# Patient Record
Sex: Male | Born: 1980 | Race: White | Hispanic: No
Health system: Southern US, Community
[De-identification: ages and names within clinical notes are randomized; demographics above are authoritative.]

---

## 2004-06-09 ENCOUNTER — Other Ambulatory Visit: Payer: Self-pay

## 2004-06-09 ENCOUNTER — Emergency Department: Payer: Self-pay | Admitting: Emergency Medicine

## 2005-02-28 ENCOUNTER — Emergency Department: Payer: Self-pay | Admitting: Emergency Medicine

## 2005-07-13 ENCOUNTER — Ambulatory Visit: Payer: Self-pay | Admitting: Family Medicine

## 2006-10-10 ENCOUNTER — Other Ambulatory Visit: Payer: Self-pay

## 2006-10-10 ENCOUNTER — Emergency Department: Payer: Self-pay | Admitting: Emergency Medicine

## 2007-04-17 ENCOUNTER — Emergency Department: Payer: Self-pay | Admitting: Emergency Medicine

## 2008-01-08 ENCOUNTER — Ambulatory Visit: Payer: Self-pay | Admitting: Family Medicine

## 2008-02-26 ENCOUNTER — Emergency Department: Payer: Self-pay | Admitting: Emergency Medicine

## 2008-08-22 ENCOUNTER — Ambulatory Visit: Payer: Self-pay | Admitting: Ophthalmology

## 2008-08-28 ENCOUNTER — Ambulatory Visit: Payer: Self-pay | Admitting: Ophthalmology

## 2011-07-07 ENCOUNTER — Ambulatory Visit: Payer: Self-pay

## 2012-02-12 ENCOUNTER — Ambulatory Visit: Payer: Self-pay

## 2012-03-10 ENCOUNTER — Emergency Department: Payer: Self-pay | Admitting: Emergency Medicine

## 2012-03-10 LAB — COMPREHENSIVE METABOLIC PANEL
Alkaline Phosphatase: 112 U/L (ref 50–136)
BUN: 54 mg/dL — ABNORMAL HIGH (ref 7–18)
Bilirubin,Total: 0.4 mg/dL (ref 0.2–1.0)
Chloride: 93 mmol/L — ABNORMAL LOW (ref 98–107)
Creatinine: 13.16 mg/dL — ABNORMAL HIGH (ref 0.60–1.30)
EGFR (African American): 5 — ABNORMAL LOW
Osmolality: 285 (ref 275–301)
SGPT (ALT): 16 U/L (ref 12–78)
Sodium: 134 mmol/L — ABNORMAL LOW (ref 136–145)
Total Protein: 7.5 g/dL (ref 6.4–8.2)

## 2012-03-10 LAB — CBC WITH DIFFERENTIAL/PLATELET
Basophil #: 0.1 10*3/uL (ref 0.0–0.1)
Basophil %: 1.4 %
Eosinophil #: 0.5 10*3/uL (ref 0.0–0.7)
Eosinophil %: 8.7 %
HCT: 30.2 % — ABNORMAL LOW (ref 40.0–52.0)
HGB: 10.5 g/dL — ABNORMAL LOW (ref 13.0–18.0)
Lymphocyte #: 0.9 10*3/uL — ABNORMAL LOW (ref 1.0–3.6)
Lymphocyte %: 14.8 %
MCH: 26.8 pg (ref 26.0–34.0)
MCV: 77 fL — ABNORMAL LOW (ref 80–100)
Monocyte %: 8.7 %
Neutrophil #: 4.1 10*3/uL (ref 1.4–6.5)
Platelet: 256 10*3/uL (ref 150–440)
RBC: 3.91 10*6/uL — ABNORMAL LOW (ref 4.40–5.90)
RDW: 18.5 % — ABNORMAL HIGH (ref 11.5–14.5)
WBC: 6.2 10*3/uL (ref 3.8–10.6)

## 2012-03-10 LAB — BODY FLUID CELL COUNT WITH DIFFERENTIAL
Basophil: 0 %
Lymphocytes: 0 %
Neutrophils: 0 %
Nucleated Cell Count: 0 /mm3

## 2012-03-10 LAB — PROTIME-INR
INR: 1.7
Prothrombin Time: 20.7 secs — ABNORMAL HIGH (ref 11.5–14.7)

## 2012-03-10 LAB — URINALYSIS, COMPLETE
Bilirubin,UR: NEGATIVE
Ketone: NEGATIVE
Nitrite: NEGATIVE
Ph: 9 (ref 4.5–8.0)
Protein: >=500
Specific Gravity: 1.009 (ref 1.003–1.030)
Squamous Epithelial: <1
WBC UR: 2 /HPF (ref 0–5)

## 2012-03-10 LAB — APTT: Activated PTT: 45.1 secs — ABNORMAL HIGH (ref 23.6–35.9)

## 2012-03-10 LAB — MAGNESIUM: Magnesium: 1.9 mg/dL

## 2012-03-11 LAB — URINE CULTURE

## 2012-03-14 LAB — BODY FLUID CULTURE

## 2012-03-15 LAB — CULTURE, BLOOD (SINGLE)

## 2012-04-04 ENCOUNTER — Emergency Department: Payer: Self-pay | Admitting: Emergency Medicine

## 2012-04-04 LAB — COMPREHENSIVE METABOLIC PANEL
Albumin: 2.4 g/dL — ABNORMAL LOW (ref 3.4–5.0)
Anion Gap: 18 — ABNORMAL HIGH (ref 7–16)
Calcium, Total: 8.6 mg/dL (ref 8.5–10.1)
Chloride: 91 mmol/L — ABNORMAL LOW (ref 98–107)
Co2: 24 mmol/L (ref 21–32)
Creatinine: 13.44 mg/dL — ABNORMAL HIGH (ref 0.60–1.30)
EGFR (African American): 5 — ABNORMAL LOW
EGFR (Non-African Amer.): 4 — ABNORMAL LOW
Glucose: 203 mg/dL — ABNORMAL HIGH (ref 65–99)
Osmolality: 286 (ref 275–301)
Potassium: 2.6 mmol/L — ABNORMAL LOW (ref 3.5–5.1)
SGOT(AST): 25 U/L (ref 15–37)
Sodium: 133 mmol/L — ABNORMAL LOW (ref 136–145)
Total Protein: 7.8 g/dL (ref 6.4–8.2)

## 2012-04-04 LAB — SEDIMENTATION RATE: Erythrocyte Sed Rate: >140 mm/hr — ABNORMAL HIGH (ref 0–15)

## 2012-04-04 LAB — CBC
HCT: 27.7 % — ABNORMAL LOW (ref 40.0–52.0)
HGB: 9.4 g/dL — ABNORMAL LOW (ref 13.0–18.0)
MCH: 25.5 pg — ABNORMAL LOW (ref 26.0–34.0)
MCHC: 34.1 g/dL (ref 32.0–36.0)
MCV: 75 fL — ABNORMAL LOW (ref 80–100)
Platelet: 393 10*3/uL (ref 150–440)
RBC: 3.71 10*6/uL — ABNORMAL LOW (ref 4.40–5.90)
RDW: 17.4 % — ABNORMAL HIGH (ref 11.5–14.5)
WBC: 10.1 10*3/uL (ref 3.8–10.6)

## 2012-04-04 LAB — PROTIME-INR
INR: 2.5
Prothrombin Time: 27.3 secs — ABNORMAL HIGH (ref 11.5–14.7)

## 2012-08-23 ENCOUNTER — Emergency Department: Payer: Self-pay | Admitting: Emergency Medicine

## 2013-10-08 ENCOUNTER — Encounter: Payer: Self-pay | Admitting: Surgery

## 2013-10-08 ENCOUNTER — Ambulatory Visit: Payer: Self-pay | Admitting: Surgery

## 2013-10-26 ENCOUNTER — Encounter: Payer: Self-pay | Admitting: Surgery

## 2013-10-30 ENCOUNTER — Ambulatory Visit: Payer: Self-pay | Admitting: Vascular Surgery

## 2013-11-26 ENCOUNTER — Encounter: Payer: Self-pay | Admitting: Surgery

## 2013-12-03 ENCOUNTER — Encounter: Payer: Self-pay | Admitting: Surgery

## 2013-12-24 ENCOUNTER — Ambulatory Visit: Payer: Self-pay | Admitting: Physician Assistant

## 2013-12-26 ENCOUNTER — Encounter: Payer: Self-pay | Admitting: Surgery

## 2014-01-07 ENCOUNTER — Emergency Department: Payer: Self-pay | Admitting: Emergency Medicine

## 2014-01-07 LAB — COMPREHENSIVE METABOLIC PANEL
ALBUMIN: 2.8 g/dL — AB (ref 3.4–5.0)
ANION GAP: 13 (ref 7–16)
Alkaline Phosphatase: 164 U/L — ABNORMAL HIGH
BUN: 35 mg/dL — ABNORMAL HIGH (ref 7–18)
Bilirubin,Total: 0.7 mg/dL (ref 0.2–1.0)
CALCIUM: 6.5 mg/dL — AB (ref 8.5–10.1)
CHLORIDE: 98 mmol/L (ref 98–107)
Co2: 25 mmol/L (ref 21–32)
Creatinine: 11.96 mg/dL — ABNORMAL HIGH (ref 0.60–1.30)
EGFR (Non-African Amer.): 5 — ABNORMAL LOW
GFR CALC AF AMER: 6 — AB
GLUCOSE: 147 mg/dL — AB (ref 65–99)
OSMOLALITY: 283 (ref 275–301)
Potassium: 3.3 mmol/L — ABNORMAL LOW (ref 3.5–5.1)
SGOT(AST): 22 U/L (ref 15–37)
SGPT (ALT): 22 U/L (ref 12–78)
Sodium: 136 mmol/L (ref 136–145)
Total Protein: 7.2 g/dL (ref 6.4–8.2)

## 2014-01-07 LAB — SALICYLATE LEVEL: Salicylates, Serum: <1.7 mg/dL

## 2014-01-07 LAB — CBC
HCT: 30.5 % — ABNORMAL LOW (ref 40.0–52.0)
HGB: 10.4 g/dL — AB (ref 13.0–18.0)
MCH: 30.9 pg (ref 26.0–34.0)
MCHC: 33.9 g/dL (ref 32.0–36.0)
MCV: 91 fL (ref 80–100)
Platelet: 220 10*3/uL (ref 150–440)
RBC: 3.35 10*6/uL — ABNORMAL LOW (ref 4.40–5.90)
RDW: 16.3 % — ABNORMAL HIGH (ref 11.5–14.5)
WBC: 8 10*3/uL (ref 3.8–10.6)

## 2014-01-07 LAB — ACETAMINOPHEN LEVEL: Acetaminophen: <2 ug/mL

## 2014-01-07 LAB — ETHANOL

## 2014-01-07 LAB — TSH: Thyroid Stimulating Horm: 1.42 u[IU]/mL

## 2014-01-08 LAB — PHOSPHORUS: Phosphorus: 6.7 mg/dL — ABNORMAL HIGH (ref 2.5–4.9)

## 2014-01-08 LAB — MAGNESIUM: MAGNESIUM: 1.6 mg/dL — AB

## 2014-03-18 ENCOUNTER — Encounter: Payer: Self-pay | Admitting: Surgery

## 2014-03-28 ENCOUNTER — Encounter: Payer: Self-pay | Admitting: Surgery

## 2014-03-29 ENCOUNTER — Ambulatory Visit: Payer: Self-pay | Admitting: Physician Assistant

## 2014-04-15 LAB — COMPREHENSIVE METABOLIC PANEL
Albumin: 2.4 g/dL — ABNORMAL LOW (ref 3.4–5.0)
Alkaline Phosphatase: 226 U/L — ABNORMAL HIGH
Anion Gap: 15 (ref 7–16)
BILIRUBIN TOTAL: 0.7 mg/dL (ref 0.2–1.0)
BUN: 67 mg/dL — ABNORMAL HIGH (ref 7–18)
CHLORIDE: 99 mmol/L (ref 98–107)
Calcium, Total: 7.5 mg/dL — ABNORMAL LOW (ref 8.5–10.1)
Co2: 25 mmol/L (ref 21–32)
Creatinine: 13.55 mg/dL — ABNORMAL HIGH (ref 0.60–1.30)
EGFR (African American): 5 — ABNORMAL LOW
EGFR (Non-African Amer.): 4 — ABNORMAL LOW
Glucose: 93 mg/dL (ref 65–99)
Osmolality: 297 (ref 275–301)
Potassium: 2.7 mmol/L — ABNORMAL LOW (ref 3.5–5.1)
SGOT(AST): 80 U/L — ABNORMAL HIGH (ref 15–37)
SGPT (ALT): 90 U/L — ABNORMAL HIGH
Sodium: 139 mmol/L (ref 136–145)
TOTAL PROTEIN: 6.3 g/dL — AB (ref 6.4–8.2)

## 2014-04-15 LAB — CBC
HCT: 28.4 % — ABNORMAL LOW (ref 40.0–52.0)
HGB: 9.1 g/dL — ABNORMAL LOW (ref 13.0–18.0)
MCH: 28.5 pg (ref 26.0–34.0)
MCHC: 32.2 g/dL (ref 32.0–36.0)
MCV: 88 fL (ref 80–100)
PLATELETS: 144 10*3/uL — AB (ref 150–440)
RBC: 3.21 10*6/uL — ABNORMAL LOW (ref 4.40–5.90)
RDW: 16.3 % — AB (ref 11.5–14.5)
WBC: 5.9 10*3/uL (ref 3.8–10.6)

## 2014-04-16 ENCOUNTER — Inpatient Hospital Stay: Payer: Self-pay | Admitting: Internal Medicine

## 2014-04-16 LAB — BASIC METABOLIC PANEL
ANION GAP: 15 (ref 7–16)
BUN: 74 mg/dL — AB (ref 7–18)
CREATININE: 14.08 mg/dL — AB (ref 0.60–1.30)
Calcium, Total: 7.5 mg/dL — ABNORMAL LOW (ref 8.5–10.1)
Chloride: 100 mmol/L (ref 98–107)
Co2: 24 mmol/L (ref 21–32)
EGFR (Non-African Amer.): 4 — ABNORMAL LOW
GFR CALC AF AMER: 5 — AB
Glucose: 99 mg/dL (ref 65–99)
OSMOLALITY: 299 (ref 275–301)
Potassium: 2.8 mmol/L — ABNORMAL LOW (ref 3.5–5.1)
Sodium: 139 mmol/L (ref 136–145)

## 2014-04-16 LAB — CBC WITH DIFFERENTIAL/PLATELET
Basophil #: 0.1 10*3/uL (ref 0.0–0.1)
Basophil %: 1.2 %
Eosinophil #: 0.4 10*3/uL (ref 0.0–0.7)
Eosinophil %: 9.3 %
HCT: 25.1 % — ABNORMAL LOW (ref 40.0–52.0)
HGB: 8.4 g/dL — ABNORMAL LOW (ref 13.0–18.0)
LYMPHS PCT: 19.5 %
Lymphocyte #: 0.8 10*3/uL — ABNORMAL LOW (ref 1.0–3.6)
MCH: 29.5 pg (ref 26.0–34.0)
MCHC: 33.3 g/dL (ref 32.0–36.0)
MCV: 89 fL (ref 80–100)
MONOS PCT: 6.8 %
Monocyte #: 0.3 x10 3/mm (ref 0.2–1.0)
Neutrophil #: 2.6 10*3/uL (ref 1.4–6.5)
Neutrophil %: 63.2 %
PLATELETS: 113 10*3/uL — AB (ref 150–440)
RBC: 2.84 10*6/uL — ABNORMAL LOW (ref 4.40–5.90)
RDW: 16.3 % — ABNORMAL HIGH (ref 11.5–14.5)
WBC: 4.2 10*3/uL (ref 3.8–10.6)

## 2014-04-16 LAB — MAGNESIUM: Magnesium: 1.7 mg/dL — ABNORMAL LOW

## 2014-04-16 LAB — POTASSIUM: Potassium: 3.4 mmol/L — ABNORMAL LOW (ref 3.5–5.1)

## 2014-04-16 LAB — PHOSPHORUS: Phosphorus: 7 mg/dL — ABNORMAL HIGH (ref 2.5–4.9)

## 2014-04-17 ENCOUNTER — Encounter: Payer: Self-pay | Admitting: Surgery

## 2014-04-25 ENCOUNTER — Encounter: Payer: Self-pay | Admitting: General Surgery

## 2014-04-28 ENCOUNTER — Encounter: Payer: Self-pay | Admitting: General Surgery

## 2014-05-04 ENCOUNTER — Emergency Department: Payer: Self-pay | Admitting: Emergency Medicine

## 2014-05-04 LAB — COMPREHENSIVE METABOLIC PANEL
ALK PHOS: 349 U/L — AB
Albumin: 2.4 g/dL — ABNORMAL LOW (ref 3.4–5.0)
Anion Gap: 10 (ref 7–16)
BUN: 56 mg/dL — ABNORMAL HIGH (ref 7–18)
Bilirubin,Total: 0.6 mg/dL (ref 0.2–1.0)
CO2: 27 mmol/L (ref 21–32)
Calcium, Total: 7.6 mg/dL — ABNORMAL LOW (ref 8.5–10.1)
Chloride: 94 mmol/L — ABNORMAL LOW (ref 98–107)
Creatinine: 11.02 mg/dL — ABNORMAL HIGH (ref 0.60–1.30)
GFR CALC AF AMER: 7 — AB
GFR CALC NON AF AMER: 6 — AB
Glucose: 119 mg/dL — ABNORMAL HIGH (ref 65–99)
Osmolality: 279 (ref 275–301)
Potassium: 3.4 mmol/L — ABNORMAL LOW (ref 3.5–5.1)
SGOT(AST): 58 U/L — ABNORMAL HIGH (ref 15–37)
SGPT (ALT): 131 U/L — ABNORMAL HIGH
SODIUM: 131 mmol/L — AB (ref 136–145)
Total Protein: 7.4 g/dL (ref 6.4–8.2)

## 2014-05-04 LAB — CBC
HCT: 28.6 % — ABNORMAL LOW (ref 40.0–52.0)
HGB: 9.7 g/dL — AB (ref 13.0–18.0)
MCH: 30 pg (ref 26.0–34.0)
MCHC: 33.8 g/dL (ref 32.0–36.0)
MCV: 89 fL (ref 80–100)
PLATELETS: 234 10*3/uL (ref 150–440)
RBC: 3.22 10*6/uL — ABNORMAL LOW (ref 4.40–5.90)
RDW: 17.3 % — AB (ref 11.5–14.5)
WBC: 9 10*3/uL (ref 3.8–10.6)

## 2014-05-04 LAB — PRO B NATRIURETIC PEPTIDE: B-Type Natriuretic Peptide: 159080 pg/mL — ABNORMAL HIGH (ref 0–125)

## 2014-05-04 LAB — TROPONIN I: Troponin-I: 0.18 ng/mL — ABNORMAL HIGH

## 2014-05-06 ENCOUNTER — Ambulatory Visit: Payer: Self-pay | Admitting: General Surgery

## 2014-05-06 ENCOUNTER — Encounter: Payer: Self-pay | Admitting: Surgery

## 2014-05-16 LAB — CBC WITH DIFFERENTIAL/PLATELET
BASOS ABS: 0.1 10*3/uL (ref 0.0–0.1)
Basophil %: 0.6 %
EOS ABS: 0.2 10*3/uL (ref 0.0–0.7)
Eosinophil %: 1.7 %
HCT: 30.1 % — ABNORMAL LOW (ref 40.0–52.0)
HGB: 10 g/dL — AB (ref 13.0–18.0)
LYMPHS ABS: 0.8 10*3/uL — AB (ref 1.0–3.6)
Lymphocyte %: 7.1 %
MCH: 28.2 pg (ref 26.0–34.0)
MCHC: 33.1 g/dL (ref 32.0–36.0)
MCV: 85 fL (ref 80–100)
Monocyte #: 0.6 x10 3/mm (ref 0.2–1.0)
Monocyte %: 5 %
Neutrophil #: 9.5 10*3/uL — ABNORMAL HIGH (ref 1.4–6.5)
Neutrophil %: 85.6 %
PLATELETS: 283 10*3/uL (ref 150–440)
RBC: 3.53 10*6/uL — ABNORMAL LOW (ref 4.40–5.90)
RDW: 18.2 % — ABNORMAL HIGH (ref 11.5–14.5)
WBC: 11.1 10*3/uL — ABNORMAL HIGH (ref 3.8–10.6)

## 2014-05-16 LAB — COMPREHENSIVE METABOLIC PANEL
ALBUMIN: 2.1 g/dL — AB (ref 3.4–5.0)
ALT: 255 U/L — AB
ANION GAP: 13 (ref 7–16)
Alkaline Phosphatase: 694 U/L — ABNORMAL HIGH
BUN: 45 mg/dL — ABNORMAL HIGH (ref 7–18)
Bilirubin,Total: 0.9 mg/dL (ref 0.2–1.0)
CALCIUM: 7.5 mg/dL — AB (ref 8.5–10.1)
CHLORIDE: 95 mmol/L — AB (ref 98–107)
Co2: 26 mmol/L (ref 21–32)
Creatinine: 10.51 mg/dL — ABNORMAL HIGH (ref 0.60–1.30)
EGFR (African American): 7 — ABNORMAL LOW
GFR CALC NON AF AMER: 6 — AB
GLUCOSE: 130 mg/dL — AB (ref 65–99)
OSMOLALITY: 282 (ref 275–301)
Potassium: 2.8 mmol/L — ABNORMAL LOW (ref 3.5–5.1)
SGOT(AST): 229 U/L — ABNORMAL HIGH (ref 15–37)
SODIUM: 134 mmol/L — AB (ref 136–145)
Total Protein: 7.4 g/dL (ref 6.4–8.2)

## 2014-05-16 LAB — LIPASE, BLOOD: LIPASE: 58 U/L — AB (ref 73–393)

## 2014-05-17 ENCOUNTER — Inpatient Hospital Stay: Payer: Self-pay | Admitting: Internal Medicine

## 2014-05-17 LAB — URINALYSIS, COMPLETE
BACTERIA: NONE SEEN
Bilirubin,UR: NEGATIVE
Glucose,UR: 150 mg/dL (ref 0–75)
KETONE: NEGATIVE
Nitrite: NEGATIVE
Ph: 5 (ref 4.5–8.0)
RBC,UR: 5 /HPF (ref 0–5)
Specific Gravity: 1.013 (ref 1.003–1.030)

## 2014-05-17 LAB — BODY FLUID CELL COUNT WITH DIFFERENTIAL
BASOS ABS: 0 %
Eosinophil: 1 %
LYMPHS PCT: 0 %
Neutrophils: 95 %
Nucleated Cell Count: 1587 /mm3
Other Cells BF: 0 %
Other Mononuclear Cells: 4 %

## 2014-05-17 LAB — HEMOGLOBIN A1C: HEMOGLOBIN A1C: 5.8 % (ref 4.2–6.3)

## 2014-05-18 LAB — CBC WITH DIFFERENTIAL/PLATELET
BASOS ABS: 0 10*3/uL (ref 0.0–0.1)
BASOS PCT: 0.3 %
Eosinophil #: 0.3 10*3/uL (ref 0.0–0.7)
Eosinophil %: 2.5 %
HCT: 22.6 % — AB (ref 40.0–52.0)
HGB: 7.5 g/dL — AB (ref 13.0–18.0)
LYMPHS PCT: 6.4 %
Lymphocyte #: 0.8 10*3/uL — ABNORMAL LOW (ref 1.0–3.6)
MCH: 28.6 pg (ref 26.0–34.0)
MCHC: 33.3 g/dL (ref 32.0–36.0)
MCV: 86 fL (ref 80–100)
Monocyte #: 0.7 x10 3/mm (ref 0.2–1.0)
Monocyte %: 5.1 %
NEUTROS ABS: 11.3 10*3/uL — AB (ref 1.4–6.5)
Neutrophil %: 85.7 %
Platelet: 180 10*3/uL (ref 150–440)
RBC: 2.63 10*6/uL — AB (ref 4.40–5.90)
RDW: 18.1 % — ABNORMAL HIGH (ref 11.5–14.5)
WBC: 13.2 10*3/uL — ABNORMAL HIGH (ref 3.8–10.6)

## 2014-05-18 LAB — BASIC METABOLIC PANEL
Anion Gap: 12 (ref 7–16)
BUN: 49 mg/dL — ABNORMAL HIGH (ref 7–18)
CHLORIDE: 93 mmol/L — AB (ref 98–107)
Calcium, Total: 6.6 mg/dL — CL (ref 8.5–10.1)
Co2: 29 mmol/L (ref 21–32)
Creatinine: 11.07 mg/dL — ABNORMAL HIGH (ref 0.60–1.30)
EGFR (African American): 7 — ABNORMAL LOW
EGFR (Non-African Amer.): 6 — ABNORMAL LOW
Glucose: 209 mg/dL — ABNORMAL HIGH (ref 65–99)
Osmolality: 287 (ref 275–301)
Potassium: 2.6 mmol/L — ABNORMAL LOW (ref 3.5–5.1)
SODIUM: 134 mmol/L — AB (ref 136–145)

## 2014-05-18 LAB — PHOSPHORUS: Phosphorus: 5.3 mg/dL — ABNORMAL HIGH (ref 2.5–4.9)

## 2014-05-19 LAB — BODY FLUID CELL COUNT WITH DIFFERENTIAL
BASOS ABS: 0 %
Eosinophil: 4 %
Lymphocytes: 8 %
NUCLEATED CELL COUNT: 11 /mm3
Neutrophils: 77 %
OTHER MONONUCLEAR CELLS: 11 %
Other Cells BF: 0 %

## 2014-05-19 LAB — COMPREHENSIVE METABOLIC PANEL
Albumin: 1.5 g/dL — ABNORMAL LOW (ref 3.4–5.0)
Alkaline Phosphatase: 392 U/L — ABNORMAL HIGH
Anion Gap: 12 (ref 7–16)
BUN: 50 mg/dL — ABNORMAL HIGH (ref 7–18)
Bilirubin,Total: 0.5 mg/dL (ref 0.2–1.0)
Calcium, Total: 7 mg/dL — CL (ref 8.5–10.1)
Chloride: 94 mmol/L — ABNORMAL LOW (ref 98–107)
Co2: 26 mmol/L (ref 21–32)
Creatinine: 10.91 mg/dL — ABNORMAL HIGH (ref 0.60–1.30)
EGFR (African American): 7 — ABNORMAL LOW
EGFR (Non-African Amer.): 6 — ABNORMAL LOW
Glucose: 260 mg/dL — ABNORMAL HIGH (ref 65–99)
Osmolality: 287 (ref 275–301)
Potassium: 2.8 mmol/L — ABNORMAL LOW (ref 3.5–5.1)
SGOT(AST): 25 U/L (ref 15–37)
SGPT (ALT): 72 U/L — ABNORMAL HIGH
Sodium: 132 mmol/L — ABNORMAL LOW (ref 136–145)
Total Protein: 5.9 g/dL — ABNORMAL LOW (ref 6.4–8.2)

## 2014-05-19 LAB — VANCOMYCIN, TROUGH: Vancomycin, Trough: 13 ug/mL (ref 10–20)

## 2014-05-20 LAB — BASIC METABOLIC PANEL
Anion Gap: 12 (ref 7–16)
BUN: 50 mg/dL — AB (ref 7–18)
Calcium, Total: 7.2 mg/dL — ABNORMAL LOW (ref 8.5–10.1)
Chloride: 96 mmol/L — ABNORMAL LOW (ref 98–107)
Co2: 28 mmol/L (ref 21–32)
Creatinine: 10.53 mg/dL — ABNORMAL HIGH (ref 0.60–1.30)
EGFR (Non-African Amer.): 6 — ABNORMAL LOW
GFR CALC AF AMER: 7 — AB
GLUCOSE: 189 mg/dL — AB (ref 65–99)
Osmolality: 290 (ref 275–301)
POTASSIUM: 2.8 mmol/L — AB (ref 3.5–5.1)
Sodium: 136 mmol/L (ref 136–145)

## 2014-05-21 LAB — CULTURE, BLOOD (SINGLE)

## 2014-05-21 LAB — BODY FLUID CULTURE

## 2014-05-22 LAB — WOUND CULTURE

## 2014-05-28 ENCOUNTER — Encounter: Payer: Self-pay | Admitting: Surgery

## 2014-06-10 ENCOUNTER — Encounter: Payer: Self-pay | Admitting: Surgery

## 2014-06-19 ENCOUNTER — Ambulatory Visit: Payer: Self-pay | Admitting: Emergency Medicine

## 2014-06-19 LAB — GC/CHLAMYDIA PROBE AMP

## 2014-06-22 ENCOUNTER — Ambulatory Visit: Payer: Self-pay | Admitting: Emergency Medicine

## 2014-06-28 ENCOUNTER — Encounter: Payer: Self-pay | Admitting: Surgery

## 2014-07-24 ENCOUNTER — Inpatient Hospital Stay: Payer: Self-pay | Admitting: Internal Medicine

## 2014-07-24 LAB — COMPREHENSIVE METABOLIC PANEL
ALK PHOS: 316 U/L — AB (ref 46–116)
ANION GAP: 14 (ref 7–16)
Albumin: 2 g/dL — ABNORMAL LOW (ref 3.4–5.0)
BUN: 62 mg/dL — ABNORMAL HIGH (ref 7–18)
Bilirubin,Total: 0.4 mg/dL (ref 0.2–1.0)
CO2: 25 mmol/L (ref 21–32)
Calcium, Total: 7.5 mg/dL — ABNORMAL LOW (ref 8.5–10.1)
Chloride: 94 mmol/L — ABNORMAL LOW (ref 98–107)
Creatinine: 11.97 mg/dL — ABNORMAL HIGH (ref 0.60–1.30)
GFR CALC AF AMER: 6 — AB
GFR CALC NON AF AMER: 5 — AB
Glucose: 163 mg/dL — ABNORMAL HIGH (ref 65–99)
OSMOLALITY: 288 (ref 275–301)
POTASSIUM: 2.8 mmol/L — AB (ref 3.5–5.1)
SGOT(AST): 63 U/L — ABNORMAL HIGH (ref 15–37)
SGPT (ALT): 51 U/L (ref 14–63)
SODIUM: 133 mmol/L — AB (ref 136–145)
Total Protein: 7.3 g/dL (ref 6.4–8.2)

## 2014-07-24 LAB — CBC
HCT: 27.6 % — ABNORMAL LOW (ref 40.0–52.0)
HGB: 9 g/dL — AB (ref 13.0–18.0)
MCH: 25.9 pg — AB (ref 26.0–34.0)
MCHC: 32.7 g/dL (ref 32.0–36.0)
MCV: 79 fL — ABNORMAL LOW (ref 80–100)
PLATELETS: 217 10*3/uL (ref 150–440)
RBC: 3.48 10*6/uL — ABNORMAL LOW (ref 4.40–5.90)
RDW: 17.3 % — ABNORMAL HIGH (ref 11.5–14.5)
WBC: 8.9 10*3/uL (ref 3.8–10.6)

## 2014-07-24 LAB — APTT: Activated PTT: 32.6 secs (ref 23.6–35.9)

## 2014-07-24 LAB — CK TOTAL AND CKMB (NOT AT ARMC)
CK, Total: 94 U/L (ref 39–308)
CK-MB: 6.6 ng/mL — AB (ref 0.5–3.6)

## 2014-07-24 LAB — PROTIME-INR
INR: 1.3
Prothrombin Time: 15.5 secs — ABNORMAL HIGH (ref 11.5–14.7)

## 2014-07-24 LAB — TROPONIN I: Troponin-I: 0.22 ng/mL — ABNORMAL HIGH

## 2014-07-25 LAB — PRO B NATRIURETIC PEPTIDE: B-Type Natriuretic Peptide: >175000 pg/mL — ABNORMAL HIGH (ref 0–125)

## 2014-07-25 LAB — TSH: Thyroid Stimulating Horm: 1.87 u[IU]/mL

## 2014-07-25 LAB — POTASSIUM
POTASSIUM: 3 mmol/L — AB (ref 3.5–5.1)
Potassium: 4.1 mmol/L (ref 3.5–5.1)

## 2014-07-26 LAB — PHOSPHORUS: Phosphorus: 8.9 mg/dL — ABNORMAL HIGH (ref 2.5–4.9)

## 2014-07-26 LAB — BASIC METABOLIC PANEL
Anion Gap: 14 (ref 7–16)
BUN: 73 mg/dL — ABNORMAL HIGH (ref 7–18)
CO2: 25 mmol/L (ref 21–32)
Calcium, Total: 8.2 mg/dL — ABNORMAL LOW (ref 8.5–10.1)
Chloride: 94 mmol/L — ABNORMAL LOW (ref 98–107)
Creatinine: 13.29 mg/dL — ABNORMAL HIGH (ref 0.60–1.30)
GFR CALC AF AMER: 6 — AB
GFR CALC NON AF AMER: 5 — AB
GLUCOSE: 115 mg/dL — AB (ref 65–99)
Osmolality: 289 (ref 275–301)
Potassium: 3.9 mmol/L (ref 3.5–5.1)
SODIUM: 133 mmol/L — AB (ref 136–145)

## 2014-07-26 LAB — CBC WITH DIFFERENTIAL/PLATELET
BASOS PCT: 0.6 %
Basophil #: 0 10*3/uL (ref 0.0–0.1)
EOS PCT: 3.2 %
Eosinophil #: 0.2 10*3/uL (ref 0.0–0.7)
HCT: 25.8 % — AB (ref 40.0–52.0)
HGB: 8.4 g/dL — AB (ref 13.0–18.0)
Lymphocyte #: 1.2 10*3/uL (ref 1.0–3.6)
Lymphocyte %: 17.5 %
MCH: 26 pg (ref 26.0–34.0)
MCHC: 32.5 g/dL (ref 32.0–36.0)
MCV: 80 fL (ref 80–100)
MONO ABS: 0.5 x10 3/mm (ref 0.2–1.0)
Monocyte %: 6.8 %
NEUTROS ABS: 5.1 10*3/uL (ref 1.4–6.5)
NEUTROS PCT: 71.9 %
Platelet: 150 10*3/uL (ref 150–440)
RBC: 3.23 10*6/uL — AB (ref 4.40–5.90)
RDW: 17.3 % — ABNORMAL HIGH (ref 11.5–14.5)
WBC: 7.1 10*3/uL (ref 3.8–10.6)

## 2014-07-26 LAB — APTT
ACTIVATED PTT: 72.8 s — AB (ref 23.6–35.9)
Activated PTT: 44 secs — ABNORMAL HIGH (ref 23.6–35.9)

## 2014-07-26 LAB — PROTIME-INR
INR: 1.5
Prothrombin Time: 17.8 secs — ABNORMAL HIGH (ref 11.5–14.7)

## 2014-07-26 LAB — HEPARIN LEVEL (UNFRACTIONATED): Anti-Xa(Unfractionated): 1.43 IU/mL — ABNORMAL HIGH (ref 0.30–0.70)

## 2014-07-27 LAB — HEPARIN LEVEL (UNFRACTIONATED)
Anti-Xa(Unfractionated): 0.93 IU/mL — ABNORMAL HIGH (ref 0.30–0.70)
Anti-Xa(Unfractionated): 0.7 IU/mL (ref 0.30–0.70)

## 2014-07-27 LAB — APTT
ACTIVATED PTT: 62.4 s — AB (ref 23.6–35.9)
ACTIVATED PTT: 58.9 s — AB (ref 23.6–35.9)

## 2014-07-27 LAB — CBC WITH DIFFERENTIAL/PLATELET
BASOS ABS: 0.1 10*3/uL (ref 0.0–0.1)
Basophil %: 0.7 %
EOS ABS: 0.1 10*3/uL (ref 0.0–0.7)
Eosinophil %: 1.4 %
HCT: 26.5 % — ABNORMAL LOW (ref 40.0–52.0)
HGB: 8.4 g/dL — ABNORMAL LOW (ref 13.0–18.0)
LYMPHS PCT: 10 %
Lymphocyte #: 1 10*3/uL (ref 1.0–3.6)
MCH: 25.6 pg — ABNORMAL LOW (ref 26.0–34.0)
MCHC: 31.9 g/dL — AB (ref 32.0–36.0)
MCV: 80 fL (ref 80–100)
Monocyte #: 0.5 x10 3/mm (ref 0.2–1.0)
Monocyte %: 4.7 %
NEUTROS PCT: 83.2 %
Neutrophil #: 8.2 10*3/uL — ABNORMAL HIGH (ref 1.4–6.5)
Platelet: 177 10*3/uL (ref 150–440)
RBC: 3.29 10*6/uL — ABNORMAL LOW (ref 4.40–5.90)
RDW: 17.5 % — AB (ref 11.5–14.5)
WBC: 9.9 10*3/uL (ref 3.8–10.6)

## 2014-07-28 LAB — CBC WITH DIFFERENTIAL/PLATELET
BASOS PCT: 0.6 %
Basophil #: 0.1 10*3/uL (ref 0.0–0.1)
EOS PCT: 1.3 %
Eosinophil #: 0.1 10*3/uL (ref 0.0–0.7)
HCT: 24 % — AB (ref 40.0–52.0)
HGB: 7.7 g/dL — ABNORMAL LOW (ref 13.0–18.0)
LYMPHS ABS: 1 10*3/uL (ref 1.0–3.6)
Lymphocyte %: 10.3 %
MCH: 25.9 pg — AB (ref 26.0–34.0)
MCHC: 32 g/dL (ref 32.0–36.0)
MCV: 81 fL (ref 80–100)
Monocyte #: 0.6 x10 3/mm (ref 0.2–1.0)
Monocyte %: 6 %
Neutrophil #: 8 10*3/uL — ABNORMAL HIGH (ref 1.4–6.5)
Neutrophil %: 81.8 %
Platelet: 143 10*3/uL — ABNORMAL LOW (ref 150–440)
RBC: 2.97 10*6/uL — AB (ref 4.40–5.90)
RDW: 17.8 % — ABNORMAL HIGH (ref 11.5–14.5)
WBC: 9.8 10*3/uL (ref 3.8–10.6)

## 2014-07-28 LAB — BASIC METABOLIC PANEL
ANION GAP: 11 (ref 7–16)
BUN: 47 mg/dL — ABNORMAL HIGH (ref 7–18)
Calcium, Total: 7.3 mg/dL — ABNORMAL LOW (ref 8.5–10.1)
Chloride: 94 mmol/L — ABNORMAL LOW (ref 98–107)
Co2: 27 mmol/L (ref 21–32)
Creatinine: 9.15 mg/dL — ABNORMAL HIGH (ref 0.60–1.30)
EGFR (Non-African Amer.): 7 — ABNORMAL LOW
GFR CALC AF AMER: 9 — AB
Glucose: 145 mg/dL — ABNORMAL HIGH (ref 65–99)
Osmolality: 279 (ref 275–301)
Potassium: 4 mmol/L (ref 3.5–5.1)
SODIUM: 132 mmol/L — AB (ref 136–145)

## 2014-07-28 LAB — HEPARIN LEVEL (UNFRACTIONATED): ANTI-XA(UNFRACTIONATED): 0.43 [IU]/mL (ref 0.30–0.70)

## 2014-07-29 LAB — CBC WITH DIFFERENTIAL/PLATELET
BASOS PCT: 0.2 %
BASOS PCT: 0.2 %
BASOS PCT: 0.2 %
BASOS PCT: 0.3 %
Basophil #: 0 10*3/uL (ref 0.0–0.1)
Basophil #: 0 10*3/uL (ref 0.0–0.1)
Basophil #: 0 10*3/uL (ref 0.0–0.1)
Basophil #: 0 10*3/uL (ref 0.0–0.1)
EOS ABS: 0 10*3/uL (ref 0.0–0.7)
EOS ABS: 0 10*3/uL (ref 0.0–0.7)
EOS PCT: 0.4 %
Eosinophil #: 0 10*3/uL (ref 0.0–0.7)
Eosinophil #: 0.1 10*3/uL (ref 0.0–0.7)
Eosinophil %: 0.1 %
Eosinophil %: 0.1 %
Eosinophil %: 0.1 %
HCT: 24.4 % — ABNORMAL LOW (ref 40.0–52.0)
HCT: 23.8 % — ABNORMAL LOW (ref 40.0–52.0)
HCT: 27.2 % — ABNORMAL LOW (ref 40.0–52.0)
HCT: 28.6 % — ABNORMAL LOW (ref 40.0–52.0)
HGB: 7.6 g/dL — AB (ref 13.0–18.0)
HGB: 7.6 g/dL — ABNORMAL LOW (ref 13.0–18.0)
HGB: 8.6 g/dL — ABNORMAL LOW (ref 13.0–18.0)
HGB: 8.6 g/dL — ABNORMAL LOW (ref 13.0–18.0)
LYMPHS ABS: 0.9 10*3/uL — AB (ref 1.0–3.6)
LYMPHS ABS: 0.9 10*3/uL — AB (ref 1.0–3.6)
LYMPHS PCT: 5 %
LYMPHS PCT: 4.9 %
LYMPHS PCT: 7.1 %
Lymphocyte #: 1 10*3/uL (ref 1.0–3.6)
Lymphocyte #: 1.2 10*3/uL (ref 1.0–3.6)
Lymphocyte %: 5.4 %
MCH: 25.6 pg — ABNORMAL LOW (ref 26.0–34.0)
MCH: 26 pg (ref 26.0–34.0)
MCH: 26.1 pg (ref 26.0–34.0)
MCH: 25.7 pg — ABNORMAL LOW (ref 26.0–34.0)
MCHC: 31.1 g/dL — ABNORMAL LOW (ref 32.0–36.0)
MCHC: 31.8 g/dL — ABNORMAL LOW (ref 32.0–36.0)
MCHC: 31.6 g/dL — AB (ref 32.0–36.0)
MCHC: 30.1 g/dL — ABNORMAL LOW (ref 32.0–36.0)
MCV: 82 fL (ref 80–100)
MCV: 82 fL (ref 80–100)
MCV: 83 fL (ref 80–100)
MCV: 86 fL (ref 80–100)
MONO ABS: 0.7 x10 3/mm (ref 0.2–1.0)
MONOS PCT: 5.3 %
MONOS PCT: 3.4 %
Monocyte #: 1.1 x10 3/mm — ABNORMAL HIGH (ref 0.2–1.0)
Monocyte #: 1 x10 3/mm (ref 0.2–1.0)
Monocyte #: 0.6 x10 3/mm (ref 0.2–1.0)
Monocyte %: 5.4 %
Monocyte %: 4 %
NEUTROS ABS: 17.4 10*3/uL — AB (ref 1.4–6.5)
NEUTROS ABS: 14.7 10*3/uL — AB (ref 1.4–6.5)
NEUTROS PCT: 89.4 %
NEUTROS PCT: 90.3 %
Neutrophil #: 18.2 10*3/uL — ABNORMAL HIGH (ref 1.4–6.5)
Neutrophil #: 15.7 10*3/uL — ABNORMAL HIGH (ref 1.4–6.5)
Neutrophil %: 89.4 %
Neutrophil %: 88.8 %
Platelet: 151 10*3/uL (ref 150–440)
Platelet: 153 10*3/uL (ref 150–440)
Platelet: 118 10*3/uL — ABNORMAL LOW (ref 150–440)
Platelet: 166 10*3/uL (ref 150–440)
RBC: 2.97 10*6/uL — ABNORMAL LOW (ref 4.40–5.90)
RBC: 2.92 10*6/uL — ABNORMAL LOW (ref 4.40–5.90)
RBC: 3.3 10*6/uL — AB (ref 4.40–5.90)
RBC: 3.35 10*6/uL — AB (ref 4.40–5.90)
RDW: 17.9 % — ABNORMAL HIGH (ref 11.5–14.5)
RDW: 17.9 % — AB (ref 11.5–14.5)
RDW: 18.3 % — ABNORMAL HIGH (ref 11.5–14.5)
RDW: 18.6 % — ABNORMAL HIGH (ref 11.5–14.5)
WBC: 20.4 10*3/uL — AB (ref 3.8–10.6)
WBC: 19.5 10*3/uL — ABNORMAL HIGH (ref 3.8–10.6)
WBC: 17.4 10*3/uL — ABNORMAL HIGH (ref 3.8–10.6)
WBC: 16.6 10*3/uL — ABNORMAL HIGH (ref 3.8–10.6)

## 2014-07-29 LAB — COMPREHENSIVE METABOLIC PANEL
ALK PHOS: 269 U/L — AB (ref 46–116)
Albumin: 1.7 g/dL — ABNORMAL LOW (ref 3.4–5.0)
Albumin: 1.6 g/dL — ABNORMAL LOW (ref 3.4–5.0)
Alkaline Phosphatase: 291 U/L — ABNORMAL HIGH (ref 46–116)
Anion Gap: 19 — ABNORMAL HIGH (ref 7–16)
Anion Gap: 23 — ABNORMAL HIGH (ref 7–16)
BILIRUBIN TOTAL: 0.8 mg/dL (ref 0.2–1.0)
BUN: 33 mg/dL — ABNORMAL HIGH (ref 7–18)
BUN: 38 mg/dL — AB (ref 7–18)
Bilirubin,Total: 1 mg/dL (ref 0.2–1.0)
CHLORIDE: 92 mmol/L — AB (ref 98–107)
CO2: 23 mmol/L (ref 21–32)
CO2: 20 mmol/L — AB (ref 21–32)
CREATININE: 6.47 mg/dL — AB (ref 0.60–1.30)
Calcium, Total: 8.6 mg/dL (ref 8.5–10.1)
Calcium, Total: 8.9 mg/dL (ref 8.5–10.1)
Chloride: 93 mmol/L — ABNORMAL LOW (ref 98–107)
Creatinine: 6.81 mg/dL — ABNORMAL HIGH (ref 0.60–1.30)
EGFR (African American): 13 — ABNORMAL LOW
EGFR (Non-African Amer.): 10 — ABNORMAL LOW
GFR CALC AF AMER: 12 — AB
GFR CALC NON AF AMER: 10 — AB
Glucose: 85 mg/dL (ref 65–99)
Glucose: 81 mg/dL (ref 65–99)
OSMOLALITY: 277 (ref 275–301)
Osmolality: 278 (ref 275–301)
Potassium: 4.7 mmol/L (ref 3.5–5.1)
Potassium: 4.6 mmol/L (ref 3.5–5.1)
SGOT(AST): 1600 U/L — ABNORMAL HIGH (ref 15–37)
SGPT (ALT): 808 U/L — ABNORMAL HIGH (ref 14–63)
SGPT (ALT): 1781 U/L — ABNORMAL HIGH (ref 14–63)
SODIUM: 135 mmol/L — AB (ref 136–145)
SODIUM: 135 mmol/L — AB (ref 136–145)
Total Protein: 6.4 g/dL (ref 6.4–8.2)
Total Protein: 6.3 g/dL — ABNORMAL LOW (ref 6.4–8.2)

## 2014-07-29 LAB — BASIC METABOLIC PANEL
Anion Gap: 16 (ref 7–16)
BUN: 55 mg/dL — ABNORMAL HIGH (ref 7–18)
CALCIUM: 8.1 mg/dL — AB (ref 8.5–10.1)
CO2: 21 mmol/L (ref 21–32)
Chloride: 92 mmol/L — ABNORMAL LOW (ref 98–107)
Creatinine: 10.39 mg/dL — ABNORMAL HIGH (ref 0.60–1.30)
EGFR (Non-African Amer.): 6 — ABNORMAL LOW
GFR CALC AF AMER: 7 — AB
Glucose: 119 mg/dL — ABNORMAL HIGH (ref 65–99)
OSMOLALITY: 275 (ref 275–301)
Potassium: 5.3 mmol/L — ABNORMAL HIGH (ref 3.5–5.1)
Sodium: 129 mmol/L — ABNORMAL LOW (ref 136–145)

## 2014-07-29 LAB — LACTATE DEHYDROGENASE: LDH: 1060 U/L — ABNORMAL HIGH (ref 85–241)

## 2014-07-29 LAB — MAGNESIUM: Magnesium: 2.2 mg/dL

## 2014-07-29 LAB — HEPARIN LEVEL (UNFRACTIONATED): ANTI-XA(UNFRACTIONATED): 0.34 [IU]/mL (ref 0.30–0.70)

## 2014-07-30 ENCOUNTER — Ambulatory Visit (HOSPITAL_COMMUNITY)
Admission: AD | Admit: 2014-07-30 | Discharge: 2014-07-30 | Disposition: A | Discharge status: Home / Self Care | Payer: Self-pay | Source: Other Acute Inpatient Hospital | Attending: Internal Medicine | Admitting: Internal Medicine

## 2014-07-30 DIAGNOSIS — I4891 Unspecified atrial fibrillation: Secondary | ICD-10-CM | POA: Insufficient documentation

## 2014-07-30 DIAGNOSIS — K559 Vascular disorder of intestine, unspecified: Secondary | ICD-10-CM | POA: Insufficient documentation

## 2014-07-30 DIAGNOSIS — K7290 Hepatic failure, unspecified without coma: Secondary | ICD-10-CM | POA: Insufficient documentation

## 2014-07-30 LAB — PROTIME-INR
INR: 2
PROTHROMBIN TIME: 23 s — AB

## 2014-08-27 DEATH — deceased

## 2014-10-19 NOTE — Discharge Summary (Signed)
PATIENT NAME:  Jacob Davis, Jacob Davis MR#:  [id number] DATE OF BIRTH:  [birth year]  DATE OF ADMISSION:  04/16/2014 DATE OF DISCHARGE:  04/16/2014  FINAL DIAGNOSES:  1.  Fracture of pelvis, pubic rami fractures.  2.  End-stage renal disease on hemodialysis.  3.  Diabetes.  4.  Hypokalemia.   MEDICATIONS ON DISCHARGE: Include clonazepam 0.5 mg 4 times a day as needed for anxiety and nervousness, Sensipar 30 mg 2 tablets daily, carvedilol 25 mg twice a day, Celexa 10 mg daily, hydroxyzine hydrochloride 1 tablet 4 times a day as needed for itching, potassium chloride 20 mEq twice a day, Novolin-N sliding scale, NovoLog sliding scale, aspirin 500 mg 2 tablets every 6 hours as needed, Renvela 800 mg 3 tablets 3 times a day with meals and 2 tablets twice a day with snacks, acetaminophen oxycodone 325/5 at 1 tablet every 8 hours as needed for severe pain.   DIET: Renal diet, regular consistency.   ACTIVITY: As tolerated.   FOLLOW-UP:   1 to 2 weeks with Dr. Trey SailorsAmy Mottl.   The patient was admitted 04/15/2014 and discharged 04/16/2014. He came in with hip pain, found to have pubic rami fractures and admitted to the hospital.  Percocet was given for pain control.  Physical therapy consultation was ordered.    LABORATORY AND RADIOLOGICAL DATA DURING THE HOSPITAL COURSE: Included a chest x-ray that showed no acute cardiopulmonary disease.  Left hip x-ray showed mild or minimally displaced left pubic rami fractures are new since 2013, favored to be acute in this setting.  White blood cell count 5.9, hemoglobin and hematocrit 9.1 and 28.4, platelet count of 144,000, glucose 93, BUN 67, creatinine 13.55, sodium 139, potassium 2.7, chloride 99, CO2 of 25, calcium 7.5, alkaline phosphatase 226, ALT 90, AST 80, phosphorus 7.0, magnesium 1.7.  A repeat potassium was 2.8 and a repeat potassium prior to going home 3.4. Hemoglobin upon going home. 8.4,  platelets 113,000.   HOSPITAL COURSE PER PROBLEM LIST:  1.  For the  patient's fractured pelvis and pubic rami fractures, the patient states that he had a fall about a week ago and could not move his leg very well and decided to come in for further evaluation and found to have a pubic rami fracture.  He walked with physical therapy, back and forth to the door x 2. The patient stated he was going home today whether I discharged him or not.  The patient refused home health.  I discharged him home with pain medication. The patient lives by himself.  2.  End-stage renal disease on peritoneal dialysis, can consider changing to peritoneal dialysis that does not pull off as much potassium.  3.  Diabetes. Continue his usual medications at home.  4.  Hypokalemia; I replaced it orally and IV,  potassium up to 3.4 upon going home.  5.  Anxiety, on clonazepam and Celexa.    TIME SPENT ON DISCHARGE: 35 minutes.     ____________________________ Herschell Dimesichard J. Renae GlossWieting, MD rjw:DT D: 04/16/2014 14:04:02 ET T: 04/16/2014 16:52:17 ET JOB#: [id number]  cc: Herschell Dimesichard J. Renae GlossWieting, MD, <Dictator> Salley ScarletICHARD J Weldon Nouri MD ELECTRONICALLY SIGNED 04/18/2014 13:25

## 2014-10-19 NOTE — Op Note (Signed)
PATIENT NAME:  Jacob Davis, Jacob Davis MR#:  [id number] DATE OF BIRTH:  1981-03-03  DATE OF PROCEDURE:  05/18/2014  PREOPERATIVE DIAGNOSIS: Right fourth toe gangrene with abscess.   POSTOPERATIVE DIAGNOSIS: Right fourth toe gangrene with abscess.  PROCEDURE PERFORMED: Amputation of right fourth toe at metatarsophalangeal joint.   SURGEON: Elicia Lui A. Ether GriffinsFowler, DPM   ANESTHESIA: IV sedation with local.   HEMOSTASIS: None.   COMPLICATIONS: None.   SPECIMEN: Gangrenous right fourth toe with abscess.   ESTIMATED BLOOD LOSS: Less than 25 mL.   OPERATIVE INDICATIONS: This is a 34 year old gentleman admitted with sepsis, with a gangrenous right fourth toe. He had noted abscess drainage last evening. He was brought into the OR today for I and D and amputation of the fourth toe. The risks, benefits, alternatives, and complications associated with the surgery were discussed with the patient, and full consent has been given.   OPERATIVE PROCEDURE: The patient was brought into the OR and placed on the operating table in the supine position. IV sedation was administered by the anesthesia team. A local block was placed proximal to the MTPJ. The right lower extremity was then prepped and draped in the usual sterile fashion. Attention was directed to the fourth toe, where 2 semielliptical incisions were made around the fourth toe. The toe was then disarticulated at the MTPJ. A fair amount of purulent drainage was noted. This was expressed and flushed out with a liter of saline, with a bulb syringe. There was a noted ulcer to the plantar aspect of the second and third MTPJs, as well. There was one small tract k I was able to open up, and cleaned this out with flush. This area was packed with iodoform gauze. The wound was then packed with Stimulan calcium sulfate beads with tobramycin and vancomycin. The incision was closed loosely with a 3-0 nylon. A bulky sterile dressing was placed on the right forefoot. A padded heel  protective dressing was placed on the right heel.   He will be readmitted to the floor. Will continue to monitor.   His potassium level was found to be low at 2.6. He was receiving potassium in the PACU, and I informed internal medicine of this critical value.    ____________________________ Argentina DonovanJustin A. Ether GriffinsFowler, DPM jaf:MT D: 05/18/2014 09:26:55 ET T: 05/18/2014 16:13:35 ET JOB#: [id number]  cc: Jill AlexandersJustin A. Ether GriffinsFowler, DPM, <Dictator> Vern Guerette DPM ELECTRONICALLY SIGNED 06/02/2014 18:58

## 2014-10-19 NOTE — Op Note (Signed)
PATIENT NAME:  Jacob Davis, Jacob Davis MR#:  [id number] DATE OF BIRTH:  08/20/1980  DATE OF PROCEDURE:  10/30/2013  PREOPERATIVE DIAGNOSIS:  Atherosclerotic occlusive disease, bilateral lower extremities, with ulceration of the left foot.   POSTOPERATIVE DIAGNOSIS: Atherosclerotic occlusive disease, bilateral lower extremities, with ulceration of the left foot.   PROCEDURES PERFORMED: 1.  Abdominal aortogram.  2.  Left lower extremity distal runoff, third order catheter placement.   SURGEON:  Renford DillsGregory G. Jaymason Ledesma, M.D.   SEDATION:  Versed 3 mg plus fentanyl 100 mcg administered IV. Continuous ECG, pulse oximetry and cardiopulmonary monitoring was performed throughout the entire procedure by the interventional radiology nurse. Total sedation time was 45 minutes.   ACCESS:  A 5-French sheath, right common femoral artery.   FLUOROSCOPY TIME:  2.8 minutes.   CONTRAST USED:  Isovue 45 mL.   INDICATIONS:  Jacob Davis is a 34 year old gentleman who presented to the office with wound of his left foot. He was referred by the wound care center as pulses are nonpalpable, and noninvasive studies suggested extensive tibial vessel disease. Risks and benefits for angiography and possible intervention were reviewed. All questions were answered. The patient agrees to proceed.   DESCRIPTION OF PROCEDURE:  The patient is taken to the special procedure suite, placed in the supine position. After adequate sedation is achieved, both groins are prepped and draped in sterile fashion. Appropriate timeout is called.   Ultrasound is placed in a sterile sleeve. Common femoral artery is identified. It is echolucent and pulsatile indicating patency. Image is recorded for the permanent record, and after 1% lidocaine is infiltrated, access to the common femoral artery is obtained with a micropuncture needle; microwire followed by microsheath, J-wire followed by a 5-French sheath. The pigtail catheter is positioned at the level of T12  and AP projection of the aorta is obtained. Pigtail catheter is repositioned to above the bifurcation and then RAO projection of the pelvis is obtained.   A stiff angled Glidewire and pigtail catheter are used to cross the bifurcation, and the catheter is advanced down into the distal external iliac where an LAO projection of the groin is obtained. Wire and catheter are then negotiated into the SFA and distal runoff is obtained. After review of the images, the catheter is removed over a wire. Oblique view of the right groin is obtained and a Mynx device deployed without difficulty. There are no immediate complications.   INTERPRETATION: The abdominal aorta is opacified with a bolus injection of contrast. There are no hemodynamically significant lesions identified. Common and external iliac arteries are patent bilaterally.   The left common femoral, profunda femoris are patent. There are sparse collaterals noted from the profunda femoris. SFA is patent throughout its course, as is the popliteal. Trifurcation is patent. Anterior tibial and posterior tibial are patent with the anterior tibial filling the foot most rapidly, including the pedal arch. Lateral plantar is patent; however, it does not appear to communicate readily with the pedal arch. The peroneal is quite small at the level of the ankle and does not significantly contribute to the arterial flow of the foot.   SUMMARY:  Patent left lower extremity distal runoff. The patient's wounds do not appear to have a significant arterial component at the macrovascular level.     ____________________________ Renford DillsGregory G. Kayleen Alig, MD ggs:dmm D: 10/30/2013 10:48:54 ET T: 10/30/2013 12:24:50 ET JOB#: [id number]  cc: Renford DillsGregory G. Zema Lizardo, MD, <Dictator> Pollyann SamplesNaveen Kumar, MD Amy K. Arvin CollardMottl, MD Renford DillsGREGORY G Quita Mcgrory MD ELECTRONICALLY SIGNED 11/27/2013  12:25 

## 2014-10-19 NOTE — Consult Note (Signed)
PATIENT NAME:  Jacob Davis, Jacob Davis MR#:  [id number] DATE OF BIRTH:  [birth year]  DATE OF CONSULTATION:    REFERRING PHYSICIAN:   CONSULTING PHYSICIAN:  Assia Meanor A. Ether GriffinsFowler, DPM  REASON FOR CONSULTATION: Right fourth toe necrosis and diabetic foot ulcers.   HISTORY OF PRESENT ILLNESS:  This is a 34 year old gentleman who was admitted from the ED after noticing worsening infection in his right foot. He had been followed at the wound care center. Apparently he developed some fevers while in the wound care center. Appeared to be septic with leukocytosis and admitted for further evaluation.   PAST MEDICAL HISTORY:  Diabetes, chronic kidney disease on peritoneal dialysis.   MEDICATIONS: Aspirin, carvedilol,  citalopram, clonazepam, hydroxyzine, Novolin, Renvela, Sensipar.    ALLERGIES: AMOXICILLIN AND PENICILLIN.   SOCIAL HISTORY: Lives at home with a friend. He denies smoking, drinking, or drugs.  REVIEW OF SYSTEMS:  Per HPI. 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 He has been dealing with multiple diabetic foot ulcerations for some time. Recent fever, but is afebrile at the current time. No shortness of breath or chest pains recently.   PHYSICAL EXAMINATION:  GENERAL: He is alert and oriented.  VASCULAR: He has nonpalpable pulses I believe secondary to edema. He has good capillary fill time to his digits.  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  DERMATOLOGIC: His left foot has a superficial diabetic ulceration approximately 2 cm in diameter on the left forefoot. There is no sign of infection from this area. It has a good healthy granular tissue base. His right posterior heel has a small superficial blister that is not infected. He has a large diabetic foot ulcer with breakdown of the epidermis but not through the dermal layer with no subcutaneous tissue noted to the forefoot under the second, third, and fourth metatarsal head regions. There is a small probe-able  opening in the central aspect of this, but no purulent drainage from this. His fourth toe is notably necrotic dorsally. There is purulent drainage with palpation with a foul odor from this with diffuse erythema to this fourth toe. MUSCULOSKELETAL: He has bilateral diffuse edema to the lower extremities. He is status post right first ray amputation.   ASSESSMENT:  Diabetic foot abscess right fourth toe with gangrenous changes of the fourth toe likely secondary to infection.   PLAN:  I believe he is at risk of developing further issues if we do not remove this infectious process. I have discussed fourth toe amputation at this time. We discussed the risks, benefits, alternatives, and complications associated with the surgery and consent has been given. We will likely proceed with surgery tomorrow. We will consider a vascular evaluation over the next several days. He will continue with local wound care upon discharge. He will need likely chronic antibiotics over the next several weeks. He is at high risk of developing further complications secondary to his neuropathy and history of chronic diabetic ulcers.    ____________________________ Argentina DonovanJustin A. Ether GriffinsFowler, DPM jaf:bu D: 05/17/2014 17:41:10 ET T: 05/17/2014 20:01:39 ET JOB#: [id number]  cc: Jill AlexandersJustin A. Ether GriffinsFowler, DPM, <Dictator> Dejay Kronk DPM ELECTRONICALLY SIGNED 06/02/2014 18:58

## 2014-10-19 NOTE — Discharge Summary (Signed)
PATIENT NAME:  Jacob Davis, Jacob Davis MR#:  [id number] DATE OF BIRTH:  March 30, 1981  DATE OF ADMISSION:  05/17/2014 DATE OF DISCHARGE:  05/20/2014  For a detailed note please check the history and physical done on admission by Dr. Sheryle Hail.   DIAGNOSES AT DISCHARGE:  1.  Sepsis secondary to diabetic foot ulcer status post amputation.  2.  Peritonitis secondary to complication from peritoneal dialysis.  3.  End-stage renal disease on peritoneal dialysis.  4.  Status post amputation of the right fourth toe.  5.  Uncontrolled diabetes.  6.  Hypertension.  7.  Secondary hyperparathyroidism.  8.  Chronic hypokalemia.   DIET: The patient is being discharged on a low-sodium, low-fat renal diet.   ACTIVITY: As tolerated.   FOLLOWUP:  Dr. Trey Sailors in the next 1-2 weeks.   DISCHARGE MEDICATIONS:  Klonopin 0.5 mg 4 times daily as needed, Sensipar 30 mg 2 tabs daily, Coreg 25 mg b.i.d., Celexa 10 mg daily, hydroxyzine 25 mg 1 tab 4 times daily as needed for itching, Novolin insulin sliding scale, aspirin 500 mg 2 tabs q. 6 hours as needed, Renvela 800 mg 2 tabs b.i.d. with meals and with snacks, Bactrim double strength 1 tab daily x 10 days, vancomycin 2 grams every 5 days intraperitoneally for 3 weeks, and potassium 20 mEq daily x 7 days.   CONSULTANTS DURING THE HOSPITAL COURSE: Dr. Gwyneth Revels from podiatry, Dr. Elijah Birk, Dr. Thedore Mins from nephrology.   PERTINENT STUDIES DONE DURING THE HOSPITAL COURSE:  An x-ray of the right foot showing prior first ray amputation at the level of the distal first metatarsal, new soft tissue gas at the plantar medial border of the right foot at the level of the distal first metatarsal, question ulcer. The patient's wound cultures growing Staphylococcus aureus, methicillin sensitive. The patient's peritoneal fluid also growing Staphylococcus aureus, also methicillin sensitive Staphylococcus aureus. Blood cultures negative.   HOSPITAL COURSE: This is a 34 year old male who  presented to the hospital on 05/17/2014 due to sepsis suspected to be secondary to an infected diabetic right foot ulcer.   1.  Sepsis. The patient presented to the hospital with fever, tachycardia, and elevated white cell count, therefore this was the clinical diagnosis. Initially the sepsis was thought to be secondary to the diabetic foot ulcer of his right foot. The patient was initially started on broad-spectrum IV antibiotics with clindamycin, vancomycin, and Levaquin and was maintained on that in the hospital. The patient also was noted to have some induration and redness near his peritoneal dialysis catheter, therefore peritoneal fluid was sent off for analysis which grew out Staphylococcus aureus and was consistent with peritonitis, therefore the likely cause of the sepsis was a combination of both his diabetic foot ulcer infection, also the peritonitis. The patient has undergone right fourth toe amputation for his diabetic foot ulcer and his wound cultures grew out methicillin sensitive Staphylococcus aureus and he is being discharged on oral Bactrim for that. He will follow up with podiatry as an outpatient. As far as his intraperitoneal Staphylococcus aureus he is being discharged on intraperitoneal vancomycin to be given at the dialysis center over the course of the next 3 weeks every 5 days. He has been afebrile and hemodynamically stable and therefore being discharged home and his blood cultures have been negative. 2.  Diabetic foot ulcer. This was likely the cause of the patient's sepsis. The patient was initially started on broad-spectrum IV antibiotics as mentioned. Podiatry consult was obtained. The patient underwent a right  fourth toe amputation, is currently postoperative day number 2, clinically doing well. His wound cultures grew methicillin-sensitive Staphylococcus aureus. He is therefore being discharged on oral Bactrim.  3.  Peritonitis. The patient's peritoneal fluid grew out  methicillin sensitive Staphylococcus aureus. His PD catheter site also showed some induration, although as per nephrology that does not need to be removed presently. The patient received 2 doses of intraperitoneal vancomycin while in the hospital, needs at least 3 more weeks of intraperitoneal vancomycin over the course of the next 3 weeks which [facility]. The patient still has some abdominal pain, but no nausea, vomiting, afebrile, and is clinically doing well and therefore being discharged home.  4.  Diabetes. The patient was maintained on sliding scale insulin, he will continue that. His hemoglobin A1c was noted to be around 5.8.  5.  Elevated LFTs. I suspect this is probably related to sepsis. His LFTs have now normalized as the sepsis has now resolved and improved.  6.  End-stage renal disease on peritoneal dialysis. The patient received peritoneal dialysis while in the hospital with the help of nephrology. Since he is being discharged home he will [facility] and continue his exchanges at home as mentioned. He does have a peritonitis for which he will get intraperitoneal vancomycin over the course of the next 3 weeks every 5 days, and this is to be arranged through his nephrologist as an outpatient, they are aware of him getting this dosage which [facility] over the next few weeks.  7.  Hypertension. The patient remained hemodynamically stable. He will continue his Coreg.  8.  Hypokalemia.  The patient's potassium has been chronically low, stable at 2.8. He has had no evidence of any acute EKG changes or arrhythmias related to his hypokalemia. The patient refused to take oral potassium supplements while in the hospital, we tried to improve his hypokalemia with the exchanges, although it has not improved at this point. The patient is being discharged on oral potassium with close followup with his  nephrologist and his primary care physician as an outpatient.  9.  Anemia of chronic disease. The patient's hemoglobin has remained stable and further needs to be followed as an outpatient. This is likely anemia of chronic disease secondary to his chronic kidney disease.  10.  Depression. The patient was maintained on Celexa and he will resume that upon discharge.   CODE STATUS: The patient is a full code.   DISPOSITION: He is being discharged home.   TIME SPENT ON DISCHARGE: 45 minutes.    ____________________________ Rolly PancakeVivek J. Cherlynn KaiserSainani, MD vjs:bu D: 05/20/2014 16:47:05 ET T: 05/20/2014 20:01:40 ET JOB#: [id number]  cc: Rolly PancakeVivek J. Cherlynn KaiserSainani, MD, <Dictator> Amy K. Mottl, MD [facility]   Houston SirenVIVEK J Bradshaw Minihan MD ELECTRONICALLY SIGNED 05/23/2014 17:23

## 2014-10-19 NOTE — Consult Note (Signed)
PATIENT NAME:  Jacob Davis, Jacob Davis MR#:  [id number] DATE OF BIRTH:  25-Dec-1980  DATE OF CONSULTATION:  05/17/2014  REFERRING PHYSICIAN:   CONSULTING PHYSICIAN:  Onaje Warne A. Ether GriffinsFowler, DPM  REASON FOR CONSULTATION: Right fourth toe necrosis and diabetic foot ulcers.   HISTORY OF PRESENT ILLNESS:  This is a 34 year old gentleman who was admitted from the ED after noticing worsening infection in his right foot. He had been followed at the wound care center. Apparently he developed some fevers while in the wound care center. Appeared to be septic with leukocytosis and admitted for further evaluation.   PAST MEDICAL HISTORY: Diabetes, chronic kidney disease on peritoneal dialysis.   MEDICATIONS: Aspirin, carvedilol, citalopram, clonazepam, hydroxyzine, Novolin, Renvela, Sensipar.    ALLERGIES: AMOXICILLIN AND PENICILLIN.   SOCIAL HISTORY: Lives at home with a friend. He denies smoking, drinking, or drugs.  REVIEW OF SYSTEMS:  Per HPI.  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 He has been dealing with multiple diabetic foot ulceration for some time. Recent fever, but is afebrile at the current time. No shortness of breath or chest pains recently.   PHYSICAL EXAMINATION:  GENERAL: He is alert and oriented.  VASCULAR: He has nonpalpable pulses I believe secondary to edema. He has good capillary fill time to his digits.  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  DERMATOLOGIC: His left foot has a superficial diabetic ulceration approximately 2 cm in diameter on the left forefoot. There is no sign of infection from this area. He has a good healthy granular tissue base. His right posterior heel has a small superficial blister that is not infected. He has a large diabetic foot ulcer with breakdown of the epidermis, but not through the dermal layer with no subcutaneous tissue noted to the forefoot under the second, third and fourth metatarsal head regions. There is a small  probe-able opening in the central aspect of this, but no purulent drainage from this. His fourth toe is notably necrotic dorsally. There is purulent drainage with palpation with a foul odor from this with diffuse erythema to this fourth toe.  MUSCULOSKELETAL: He has bilateral diffuse edema to the lower extremities. He is status post right first ray amputation.   ASSESSMENT:  Diabetic foot abscess, right fourth toe with gangrenous changes of the fourth toe likely secondary to infection.   PLAN: I believe he is at risk of developing further issues if we do not remove this infectious process. I have discussed fourth toe amputation at this time. We discussed the risks, benefits, alternatives, and complications associated with the surgery and consent has been given. We will likely proceed with surgery tomorrow. We will consider a vascular evaluation over the next several days. He will continue with local wound care upon discharge. He will need likely chronic antibiotics over the next several weeks. He is at high risk of developing further complications secondary to his neuropathy and history of chronic diabetic ulcers.    ____________________________ Argentina DonovanJustin A. Ether GriffinsFowler, DPM jaf:bu D: 05/17/2014 17:41:10 ET T: 05/17/2014 20:12:27 ET JOB#: [id number]  cc: Jill AlexandersJustin A. Ether GriffinsFowler, DPM, <Dictator> Aevah Stansbery DPM ELECTRONICALLY SIGNED 06/02/2014 18:58

## 2014-10-19 NOTE — H&P (Signed)
PATIENT NAME:  Jacob Davis, Jacob Davis MR#:  [id number] DATE OF BIRTH:  [birth year]  DATE OF ADMISSION:  05/17/2014  REFERRING PHYSICIAN:  Lurena Joiner L. Lord, MD  PRIMARY CARE PHYSICIAN:  Amy K. Mottl, MD  ADMISSION DIAGNOSIS:  Sepsis.   HISTORY OF PRESENT ILLNESS:  This is a 34 year old Caucasian male who presents to the Emergency Department from the wound care clinic. The patient went to the clinic for his usual scheduled appointment for diabetic foot ulcers, where he admitted that he was feeling colder than usual. He is a dialysis patient and frequently has chills, but was found to be febrile in clinic and sent to the Emergency Department for evaluation. His first temperature here in the Emergency Department was afebrile, but the patient did have a mild leukocytosis as well as increased heart rate, which prompted the Emergency Department to call for admission.   REVIEW OF SYSTEMS: CONSTITUTIONAL:  The patient denies fever, but admits to chills.  EYES:  Denies inflammation or blurred vision.  EARS, NOSE, AND THROAT:  Denies tinnitus or difficulty swallowing.  RESPIRATORY:  Denies cough or shortness of breath.  CARDIOVASCULAR:  Denies chest pain or palpitations.  GASTROINTESTINAL:  Denies nausea, vomiting, diarrhea, or abdominal pain.  GENITOURINARY:  Denies dysuria, increased frequency, or hesitancy of urination.  ENDOCRINE:  Denies polyuria or polydipsia. He admits that his blood sugars have been between 100 and 150. He uses his insulin per sliding scale as directed.  HEMATOLOGIC AND LYMPHATIC:  Denies easy bruising or bleeding.  INTEGUMENT:  Denies rashes, but admits to the open wounds on his foot and a painful lesion on the posterior aspect of his right heel.  MUSCULOSKELETAL:  Denies arthralgias, but admits to myalgias and cramping in his calves when he walks.  NEUROLOGIC:  Admits to some numbness in his extremities, but denies dysarthria.  PSYCHIATRIC:  Admits to depression. Denies anxiety or  suicidal ideation.   PAST MEDICAL HISTORY:  Diabetes type 2, end-stage renal disease on peritoneal dialysis, hypertension, and depression.   PAST SURGICAL HISTORY:  The patient has had major back surgery. 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   FAMILY HISTORY:  The patient's father and siblings have diabetes type 2. His father is deceased of thyroid cancer. His sister also has thyroid cancer and is in treatment.   SOCIAL HISTORY:  He lives with a friend. He denies smoking, drinking, or using any drugs.   MEDICATIONS: 1.  Aspirin 500 mg 2 tablets p.o. every 6 hours as needed.  2.  Carvedilol 25 mg 1 tab p.o. b.i.d.  3.  Citalopram 10 mg 1 tablet p.o. daily.  4.  Clonazepam 0.5 mg 1 tablet p.o. 4 times a day as needed for anxiety.  5.  Hydroxyzine 25 mg 1 tablet p.o. 4 times a day as needed for itching.  6.  Novolin recombinant insulin per sliding scale.  7.  Renvela 800 mg 2 tablets p.o. b.i.d. as needed with snacks.  8.  Sensipar 30 mg 2 tablets p.o. once a day.   ALLERGIES:  AMOXICILLIN AND PENICILLIN.   PERTINENT LABORATORY RESULTS AND RADIOGRAPHIC FINDINGS:  Serum glucose is 130, BUN 45, creatinine 10.51, sodium 134, potassium 2.8, chloride 95, bicarbonate 26, calcium 7.5, lipase 58, serum albumin 2.1, alkaline phosphatase 694, AST 229, and ALT is 255. White blood cell count is 11.1, hemoglobin 10, and hematocrit is 30.1.   X-ray of the right  foot shows primary first ray amputation of the distal first metatarsal. There is new soft tissue gas on the plantar medial border of the right foot at the level of the distal first metatarsal. There is no definite acute bone destruction identified to suggest osteomyelitis.   PHYSICAL EXAMINATION: VITAL SIGNS:  Temperature is 98.4, pulse 113, respirations 18, blood pressure 151/72, and pulse oximetry is 97% on room air.  GENERAL:  The patient is alert and  oriented x 3 and in no apparent distress.  HEENT:  Normocephalic, atraumatic. Pupils are equal, round, and reactive to light and accommodation. Extraocular movements are intact. Mucous membranes are moist.  NECK:  Trachea is midline. No adenopathy.  CHEST:  Symmetric and atraumatic.  CARDIOVASCULAR:  Tachycardic rate, normal rhythm. Normal S1, S2. No rubs, clicks, or murmurs appreciated.  LUNGS:  Clear to auscultation bilaterally. Normal effort and excursion.  ABDOMEN:  Positive bowel sounds. Soft, nontender, nondistended. No hepatosplenomegaly. Peritoneal dialysis catheter is in place with the stoma clean and dressed.  GENITOURINARY:  Deferred.  MUSCULOSKELETAL:  The patient moves all 4 extremities equally. There is 5/5 strength in upper and lower extremities bilaterally.  SKIN:  The patient has discoloration and nodular appearance to his lower calves that is consistent with chronic venous stasis dermatitis. He also has a tender, erythematous sloughing lesion between his second and third right toes. There is an asymmetric ulcer of the ball of the right foot that has granulation tissue around the borders, but some small areas of supportive tissue in the middle of the ulcer. There is also an ulcer on the plantar surface of his left foot that is dressed with an antibiotic impregnated pad. On the posterior aspect of his right heel, the patient has a stage II pressure ulcer that the patient states was filled with blood recently, but popped on its own. There is no clubbing or cyanosis, but he does have at least 1 to 2+ pitting edema of his lower extremities.  NEUROLOGIC:  Cranial nerves II through XII are grossly intact.  PSYCHIATRIC:  Mood is normal. Affect is congruent.   ASSESSMENT AND PLAN:  This is a 34 year old male admitted for sepsis secondary to the diabetic foot ulcers.   1.  Sepsis. The patient meets criteria via leukocytosis and heart rate as well as the report of fever. We have obtained blood  cultures, and we have also retrieved some peritoneal fluid for culture as well. The patient is given clindamycin in the Emergency Department. I have added vancomycin. We will continue to monitor the patient. He is hemodynamically stable at this time.  2.  Diabetic ulcers. The patient has multiple wounds on both feet. I have undressed and rewrapped the right foot. We will need to add some pressure-relieving boots while the patient is in bed to promote the healing of the pressure ulcer on his heel. His blood sugars are well managed, and his hemoglobin A1c is 5.3. We will continue to manage his blood sugars and to provide IV antibiotics. We will consult wound care again if necessary for a superficial debriding of his plantar ulcers.  3.  End-stage renal disease. We will contact nephrology about continuing peritoneal dialysis while in the hospital. 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  4.  Hypertension. Continue Coreg.  5.  Hypokalemia. Potassium has been repleted in the Emergency Department, and I will add potassium supplementation this morning. He can receive potassium in the dialysate while on dialysis this evening.  6.  Depression. Continue  citalopram.  7.  Deep vein thrombosis prophylaxis with heparin.  8.  Gastrointestinal prophylaxis is unnecessary, as the patient is not critically ill.   CODE STATUS:  The patient is a full code.   TIME SPENT ON ADMISSION ORDERS AND PATIENT CARE:  Approximately 55 minutes.      ____________________________ Kelton Pillar. Sheryle Hail, MD msd:nb D: 05/17/2014 06:43:29 ET T: 05/17/2014 07:13:27 ET JOB#: [id number]  cc: Kelton Pillar. Sheryle Hail, MD, <Dictator> Kelton Pillar DIAMOND MD ELECTRONICALLY SIGNED 05/18/2014 0:47

## 2014-10-19 NOTE — H&P (Signed)
PATIENT NAME:  Jacob Davis, Jacob Davis MR#:  [id number] DATE OF BIRTH:  27-Apr-1981  DATE OF ADMISSION:  04/15/2014  PRIMARY CARE PHYSICIAN: Amy K. Mottl, MD  REFERRING PHYSICIAN: Kathreen Devoid. Paduchowski, MD      CHIEF COMPLAINT: Hip pain.  HISTORY OF PRESENT ILLNESS: Mr. Jacob Davis is a 34 year old male with history of end-stage renal disease on hemodialysis, diabetes mellitus, hypertension, hyperlipidemia, who comes to the Emergency Department with complaints of hip pain. The patient states he fell down about 5 days' back, tripped and fell down. The patient had some pain; however, was able to get up and walk. However, since this morning, he started to experience worsening of the pain; unable to get up and walk. Concerning this, EMS was called and he was brought to the Emergency Department. On workup in the Emergency Department, the patient was found to have left superior and inferior pubic rami fractures. The patient attempted to walk in the Emergency Department. As the patient was unable to walk, the decision was made to admit the patient for observation and pain control. The patient was quite somnolent; however, was able to answer the questions appropriately.   PAST MEDICAL HISTORY:  1.  Hypertension.            2.  Diabetes mellitus type 2. 3.  End-stage renal disease on peritoneal hemodialysis.  PAST SURGICAL HISTORY:  1.  Back surgery. 2.  Kidney surgery.  ALLERGIES:  1.  PENICILLIN.  2.  OXYCONTIN. 3.  AMOXICILLIN.  HOME MEDICATIONS:  1.  Clonazepam 0.5 mg 4 times a day. 2.  Hydralazine 50 mg 2 times a day. 3.  Sensipar 60 mg once a day. 4.  Carvedilol 25 mg 2 times a day. 5.  Lisinopril 5 mg once a day. 6.  Citalopram 10 mg once a day. 7.  Hydroxyzine 1 tablet 4 times a day. 8.  Gabapentin 300 mg at bedtime. 9.  Prednisone 10 mg once a day.  SOCIAL HISTORY: Denies smoking, drinking alcohol, or using illicit drugs. He lives by himself, separated.  FAMILY HISTORY: Diabetes  mellitus.  REVIEW OF SYSTEMS:  CONSTITUTIONAL: The patient is quite somnolent. States he has generalized weakness.  He has no change in vision.  ENT: No change in hearing.  RESPIRATORY: No cough, shortness of breath.  CARDIOVASCULAR: No chest pain, palpitations. GASTROINTESTINAL: No nausea, vomiting, abdominal pain. GENITOURINARY: No dysuria or hematuria. HEMATOLOGIC: No easy bruising or bleeding. SKIN: No rash or lesions. MUSCULOSKELETAL: No joint pains or aches.  NEUROLOGIC: No weakness or numbness in any part of the body.   PHYSICAL EXAMINATION:  GENERAL: This is a well-built, well-nourished, age-appropriate male, lying down in the bed, not in distress. VITAL SIGNS: Temperature 98.5, pulse 98, blood pressure 143/94, respiratory rate 20, oxygen saturation 94% on room air.  HEENT: Head normocephalic, atraumatic. No scleral icterus. Conjunctivae normal. Pupils equal and react to light. Mucous membranes moist. No pharyngeal erythema. NECK: Supple. No lymphadenopathy. No JVD. No carotid bruits.  CHEST: No focal tenderness. LUNGS: Bilaterally clear to auscultation.  HEART: S1, S2 regular. No murmurs are heard. ABDOMEN: Bowel sounds are present. Soft, nontender, nondistended. Peritoneal dialysis catheter in place.  EXTREMITIES: No pedal edema. Pulses 2+. NEUROLOGIC: The patient is alert, oriented to place, person, and time. Cranial nerves II-XII intact. Motor 5/5 in upper and lower extremities.   LABORATORY DATA: CMP: BUN 67, creatinine of 13.5, potassium of 2.7, alkaline phosphatase 227, AST 80, ALT 90. CBC: WBC 5.9, hemoglobin 9.1, platelet count 144,000.  Chest  x-ray one view portable: No acute cardiopulmonary disease.   X-ray of the hip: Left pubic rami fractures, inferior and superior.  ASSESSMENT AND PLAN: Mr. Jacob Davis is a 34 year old male on peritoneal dialysis had a mechanical fall and sustained left upper and lower pubic rami fractures. 1.  Pelvic fractures of pubic  rami, upper and lower. We will involve physical therapy in the morning. Continue with Percocet 5/325 mg and follow up. 2.  End-stage renal disease, on hemodialysis. Consult nephrology for peritoneal dialysis.  3.  Diabetes mellitus. Continue on sliding scale insulin.  4.  Hypertension. Continue lisinopril, Coreg. 5.  Keep the patient on deep vein thrombosis prophylaxis with heparin.  TIME SPENT: 50 minutes.     ____________________________ Susa GriffinsPadmaja Dacen Frayre, MD pv:ts D: 04/16/2014 02:23:00 ET T: 04/16/2014 02:39:57 ET JOB#: [id number]  cc: Susa GriffinsPadmaja Giavonna Pflum, MD, <Dictator> Susa GriffinsPADMAJA Edilia Ghuman MD ELECTRONICALLY SIGNED 04/27/2014 23:20

## 2014-10-21 LAB — SURGICAL PATHOLOGY

## 2014-10-27 NOTE — H&P (Signed)
PATIENT NAME:  Jacob Davis, ARDOIN MR#:  [id number] DATE OF BIRTH:  1980-09-27  DATE OF ADMISSION:  07/24/2014  REFERRING PHYSICIAN: Loraine Leriche R. Fanny Bien, MD  PRIMARY CARE PHYSICIAN: Amy K. Mottl, MD   ADMISSION DIAGNOSIS: Atrial with RVR and syncope.   HISTORY OF PRESENT ILLNESS: This is a 34 year old male who presents to the Emergency Department via EMS after briefly losing consciousness. The patient was sitting in a chair preparing to eat dinner when he presumably fainted. His roommate called EMS. The patient had regained consciousness by the time they arrived and in transit was found to have a heart rate greater than 180. Once he arrived to the Emergency Department, he was found to have atrial fibrillation with RVR and given diltiazem 10 mg IV, which initially controlled his rate. He was subsequently started on diltiazem orally, but his heart rate continues to fluctuate from 100 up to 130. Due to his persistent tachycardia and atrial fibrillation, the Emergency Department called for admission.   REVIEW OF SYSTEMS:  CONSTITUTIONAL: The patient denies fever, but admits to fatigue.  EYES: Denies blurred vision or inflammation.  EARS, NOSE AND THROAT: Denies tinnitus or sore throat.  RESPIRATORY: Denies cough or shortness of breath.  CARDIOVASCULAR: Denies chest pain or palpitations.  GASTROINTESTINAL: Denies nausea, vomiting, diarrhea, or abdominal pain.  GENITOURINARY: Denies dysuria, increased frequency, or hesitancy of urination.  ENDOCRINE: Denies polyuria or polydipsia.  HEMATOLOGIC AND LYMPHATIC: Admits to easy bruising, but denies bleeding.  INTEGUMENTARY: Denies rashes or lesions at this time.  MUSCULOSKELETAL: Denies arthralgias or myalgias.  NEUROLOGIC: Denies numbness or difficulty speaking.  PSYCHIATRIC: Admits to depression, but denies suicidal ideation.   PAST MEDICAL HISTORY: Diabetes mellitus type 2, end-stage renal disease on peritoneal dialysis, hypertension, and depression.   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 He has had major back surgery as well.   SOCIAL HISTORY: The patient lives with a friend. He does not smoke, drink, or do any illegal drugs.   FAMILY HISTORY: Father is deceased of thyroid cancer. His sister currently has thyroid cancer and is in treatment. His father and his siblings all have had diabetes type 2.   MEDICATIONS:  1.  Carvedilol 25 mg 1 tablet p.o. b.i.d.  2.  Citalopram 10 mg 1 tablet p.o. daily.  3.  Clonazepam 0.5 mg 1 tablet p.o. 4 times a day as needed for anxiety.  4.  Gabapentin 300 mg 1 tablet p.o. b.i.d.  5.  Hydroxyzine 25 mg 1 tablet p.o. 4 times a day as needed for itching.  6.  Ketoconazole 2% topical cream applied to affected area once a day.  7.  Novolin subcutaneous insulin per sliding scale.  8.  Renvela 800 mg 2 tablets p.o. b.i.d. with snacks.  9.  Sensipar 30 mg 1 tablet p.o. daily.   ALLERGIES: AMOXICILLIN AND PENICILLIN.   PERTINENT LABORATORY RESULTS AND RADIOGRAPHIC FINDINGS: Serum glucose is 163. BNP is greater than 175,000. BUN 62, creatinine is 11.9, serum sodium 133, potassium 2.8, chloride is 94, bicarbonate is 25, calcium is 7.5, serum albumin is 2, alkaline phosphatase is 316, AST is 63, ALT 51. Troponin is 0.22. White blood cell count is 8.9, hemoglobin is 9, hematocrit is 27.6, platelet count 217,000, MCV is 79, INR is 1.3. Chest x-ray shows no active disease.   PHYSICAL EXAMINATION:  VITAL SIGNS: Temperature is 97.7, pulse 107, respirations 19, blood pressure 116/93, pulse  oximetry is 100% on room air.  GENERAL: The patient is alert and oriented x 3, in no apparent distress.  HEENT: Normocephalic, atraumatic. Pupils equal, round, and reactive to light and accommodation. Extraocular movements are intact. Mucous membranes are moist.  NECK: Trachea is midline. No adenopathy. Thyroid is nonpalpable, nontender.   CHEST: Symmetric and atraumatic.  CARDIOVASCULAR: Irregularly irregular tachycardic rate, but normal S1, S2. No rubs, clicks, or murmurs appreciated.  LUNGS: Clear to auscultation bilaterally. Normal effort and excursion.  ABDOMEN: Positive bowel sounds. Soft, nontender, nondistended. No hepatosplenomegaly.  GENITOURINARY: Deferred.  MUSCULOSKELETAL: The patient moves all 4 extremities equally. I have not walked the patient, as he states that his legs feel weak and somewhat heavy.  SKIN: Warm and dry. No rashes or lesions.  EXTREMITIES: No clubbing, cyanosis. There is 1+ nonpitting edema of his lower extremities up to the knees.  NEUROLOGIC: Cranial nerves II-XII are grossly intact.  PSYCHIATRIC: Mood is normal. Affect is congruent. The patient has excellent insight and judgment into his illness.   ASSESSMENT AND PLAN: This is a 34 year old male admitted for atypical rapid ventricular response and syncope.  1.  Atrial fibrillation with rapid ventricular response: The patient was initially rate controlled after diltiazem 10 mg intravenous. He was given 30 mg orally to sustain his dose of calcium channel blocker; however, his rate is still within the 100-130 range. I will attempt to give the patient an intravenous bolus before considering drip. He may need an extended release version of diltiazem if this indeed is the medicine he will respond to most. He currently has no chest pain or shortness of breath. His increased troponin is likely secondary persistent tachycardia and it remains elevated due to his end-stage renal disease. I will discuss with cardiology potentially starting anticoagulation on him. Unless we can stop his atrial fibrillation in and have him return to normal sinus rhythm, he will likely benefit from anticoagulation.  2.  Syncope: This happened at rest and is likely due to rapid ventricular rate. I have placed the patient on telemetry. I have also consulted cardiology as well.  3.   Diabetes type 2: Sliding scale insulin while the patient is hospitalized.  4.  End-stage renal disease: We will arrange dialysis tomorrow if the patient remains in the hospital. He will need an adapter for his peritoneal dialysis catheter. Continue Renvela and Sensipar.  5.  Hypertension: Continue carvedilol.  6.  Deep vein thrombosis prophylaxis: Heparin.  7.  Gastrointestinal prophylaxis: None.   CODE STATUS: The patient is a full code.   TIME SPENT ON ADMISSION ORDERS AND PATIENT CARE: Approximately 45 minutes.    ____________________________ Kelton PillarMichael S. Sheryle Hailiamond, MD msd:bm D: 07/25/2014 02:17:30 ET T: 07/25/2014 03:00:53 ET JOB#: [id number]  cc: Kelton PillarMichael S. Sheryle Hailiamond, MD, <Dictator> Kelton PillarMICHAEL S Meshilem Machuca MD ELECTRONICALLY SIGNED 07/31/2014 7:16

## 2014-10-27 NOTE — Consult Note (Signed)
Patient seen consult dictated, Will change cardizem, to amiodrone drip as will try to convert to NSR.  Electronic Signatures: Radene KneeKhan, Shaukat Ali (MD)  (Signed on 28-Jan-16 08:49)  Authored  Last Updated: 28-Jan-16 08:49 by Radene KneeKhan, Shaukat Ali (MD)

## 2014-10-27 NOTE — Consult Note (Signed)
Admit Reason:   Lesion of penis (N48.9): Status: Active, Coding System: ICD10, Coded Name: Disorder of penis, unspecified    left pelvic fx:    erectile dysfuntion:    Peritoneal Dialysis:    Dialysis:    htn:    Diabetes Mellitus, Type II (NIDD):    toe:    Back Surgery:    kidney surgery-blocked:   Home Medications: Medication Instructions Status  ketoconazole topical 2% topical cream Apply topically to affected area once a day Active  clonazePAM 0.5 mg oral tablet 0.5 milligram(s) orally 4 times a day, As Needed - for Anxiety, Nervousness Active  Sensipar 30 mg oral tablet 60 milligram(s) orally once a day Active  carvedilol 25 mg oral tablet 1 tab(s) orally 2 times a day Active  hydrOXYzine hydrochloride hydrochloride 25 mg oral tablet 1 tab(s) orally 4 times a day, As Needed - for Itching Active  NovoLIN N human recombinant 100 units/mL subcutaneous suspension 1 dose(s) subcutaneous per sliding scale Active  NovoLOG 100 units/mL subcutaneous solution 1 dose(s) subcutaneous per sliding scale Active  Renvela carbonate 800 mg oral tablet 2 tab(s) orally 2 times a day, As Needed with snacks Active  gabapentin 300 mg oral capsule 1 cap(s) orally once a day (at bedtime) Active   Lab Results: Routine Chem:  29-Jan-16 04:22   Sodium, Serum  133  Routine Coag:  30-Jan-16 05:39   Activated PTT (APTT)  62.4 (A HCT value >55% may artifactually increase the APTT. In one study, the increase was an average of 19%. Reference: "Effect on Routine and Special Coagulation Testing Values of Citrate Anticoagulant Adjustment in Patients with High HCT Values." American Journal of Clinical Pathology 2006;126:400-405.)    13:09   Activated PTT (APTT)  58.9 (A HCT value >55% may artifactually increase the APTT. In one study, the increase was an average of 19%. Reference: "Effect on Routine and Special Coagulation Testing Values of Citrate Anticoagulant Adjustment in Patients with  High HCT Values." American Journal of Clinical Pathology 2006;126:400-405.)  Routine Hem:  30-Jan-16 05:39   WBC (CBC) 9.9  RBC (CBC)  3.29  Hemoglobin (CBC)  8.4  Hematocrit (CBC)  26.5  Platelet Count (CBC) 177  MCV 80  MCH  25.6  MCHC  31.9  RDW  17.5  Neutrophil % 83.2  Lymphocyte % 10.0  Monocyte % 4.7  Eosinophil % 1.4  Basophil % 0.7  Neutrophil #  8.2  Lymphocyte # 1.0  Monocyte # 0.5  Eosinophil # 0.1  Basophil # 0.1 (Result(s) reported on 27 Jul 2014 at 06:25AM.)    Penicillin: Unknown  Amoxicillin: Hives, Itching  Nursing Flowsheets: **Vital Signs.:   30-Jan-16 19:27  Vital Signs Type Routine  Temperature Temperature (F) 97.6  Celsius 36.4  Temperature Source oral  Pulse Pulse 63  Respirations Respirations 18  Systolic BP Systolic BP 108  Diastolic BP (mmHg) Diastolic BP (mmHg) 75  Mean BP 86  Pulse Ox % Pulse Ox % 98  Pulse Ox Activity Level  At rest  Oxygen Delivery Room Air/ 21 %  *Intake and Output.:   Shift 30-Jan-16 23:00  Grand Totals Intake:   Output:  0    Net:  0 24 Hr.:  240  Urine ml     Out:  0  Length of Stay Totals Intake:  2205.9 Output:  1500    Net:  705.9    General Aspect 34 year old man with ESRD on peritoneal dialysis, hypertension, diabetes type II, obesity and  depression, with a lesion covering the dorsal surface of his glans penis that has been present over the past several months.  The area is necrotic appearing and exquisitely tender.  He has tried antibiotic cream and ketoconazole without resolution. He denies fevers/chills. He is able to void, but has noticed some pain with urination with occasional hematuria and white debris in his urine.   Case History and Physical Exam:  Chief Complaint Penile lesion   Past Medical Health Hypertension, Diabetes Mellitus, Other   Past Surgical History right first two amputation, right fifth metacarpal amputation, back surgery   Family History thyroid cancer   HEENT PERLA   Other  generally ill appearing   Neck/Nodes Supple   Chest/Lungs Clear   Cardiovascular No Murmurs or Gallops  Normal Sinus Rhythm   Abdomen Benign   Genitalia Other  2cm ischemic patch of dry gangrene on the dorsal glans penis, appears to be including the urethral meatus. Very tender to touch, but soft.   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   Plan 1) Keep the dry gangrenous areas of the glans penis clean and dry.  Dry gauze dressings +/- topical antibiotics need to be changed BID.  Would recommend he be seen by a wound care specialist to assist. 2) The infected areas will likely autoamputate.  I would not recommend debridement of the dry gangrene in these cases, as it can lead to worsening infection. 3) Broad-spectrum antibiotics 4) Pain control  Thank you for involving me in the care of [name]. Please call Urology with any further questions.   Electronic Signatures: Graceann CongressKaplan, Lumi Winslett G (MD)  (Signed 30-Jan-16 20:35)  Authored: Health Issues, Significant Events - History, Home Medications, Labs, Allergies, Vital Signs, General Aspect/Present Illness, History and Physical Exam, Impression/Plan   Last Updated: 30-Jan-16 20:35 by Graceann CongressKaplan, Ersel Wadleigh G (MD)

## 2014-10-27 NOTE — Consult Note (Signed)
PATIENT NAME:  Jacob Davis, Jacob MR#:  [id number] DATE OF BIRTH:  30-Apr-1981  DATE OF CONSULTATION:  07/29/2014  REFERRING PHYSICIAN:   CONSULTING PHYSICIAN:  Dow AdolphMatthew Amory Simonetti, MD  REFERRING PHYSICIAN:  Dr. Delfino LovettVipul Shah.   REASON FOR CONSULTATION: Abdominal pain, nausea, and vomiting.   HISTORY OF PRESENT ILLNESS: Mr. Jacob Davis is a very ill 34 year old male with a past medical history notable for DM2, end-stage renal disease on peritoneal dialysis, hypertension, who presented to the hospital initially on 07/24/2014 for evaluation of syncope and atrial fibrillation with RVR.   Today Mr. Jacob Davis was noted to be increasingly confused. He also intermittently complained of some abdominal pain and has several episodes of large-volume vomiting. He was also noted to have an increase in his leukocyte count dramatically to 28.   He did go for a CT of abdomen and pelvis today. The CAT scan does show evidence of gas in the portal vein along with pneumatosis of the jejunum. Surgery is consulted for further management of this.   REVIEW OF SYSTEMS: Unable to obtain.   PAST MEDICAL HISTORY:  1. DM2.  2. End-stage renal disease.  3. Hypertension.  4. Depression.  5. Peripheral vascular disease.   PAST SURGICAL HISTORY: He has had a right toe amputation.   SOCIAL HISTORY: Unable to obtain, per the notes denies tobacco and alcohol.   FAMILY HISTORY: Unable to obtain, again per the notes there is a family history of thyroid cancer.   MEDICATIONS:   1. Carvedilol 25 mg b.i.d.  2. Citalopram 10 mg daily.  3. Clonazepam 0.5 four times a day p.r.n.  4. Gabapentin 300 mg b.i.d.  5. Hydroxyzine 25 mg 4 times a day.  6. Novolin sliding scale.  7. Renvela 800 mg 2 tabs twice a day.  8. Sensipar 30 mg daily.   ALLERGIES: AMOXICILLIN AND PENICILLIN.   PHYSICAL EXAMINATION:  VITAL SIGNS: Temperature is 97.7, pulse is 96, respirations are 18, blood pressure is 101/67, pulse oximetry is 100% on room air.    GENERAL: He is somnolent does answer some questions, alert and oriented only x 1.  HEENT: Normocephalic/atraumatic. Extraocular movements are intact. Anicteric. NECK: Soft, supple. JVP appears normal. No adenopathy. CHEST: Clear to auscultation. No wheeze or crackle. Respirations unlabored. HEART: Regular. No murmur, rub, or gallop.  Normal S1 and S2. ABDOMEN: He has diffuse tenderness to palpation. Abdomen is somewhat soft. No rebound or guarding. Decreased bowel sounds.   GENITOURINARY: He does have some evidence of necrosis of the tip of the penis, but no evidence of Fournier gangrene.  EXTREMITIES: No swelling, well perfused. SKIN: No rash or lesion. Skin color, texture, turgor normal. NEUROLOGICAL: Grossly intact. PSYCHIATRIC: Normal tone and affect. MUSCULOSKELETAL: No joint swelling or erythema.   LABORATORY DATA: Sodium is 129, potassium 5.3, BUN 55, creatinine 10.39. The hemoglobin currently is 7.6, hematocrit is 24, platelets are 153,000, white count is 20. PTT is currently 56.   IMAGING:  See HPI for CT scan results.   ASSESSMENT AND PLAN:  Abdominal pain, nausea, vomiting: He appears to be quite acutely ill at this time. He does have pneumatosis of the jejunum and portal venous gas. I do agree with urgent surgical consult and broad-spectrum antibiotics. There is little else I have to offer at this time in terms of endoscopic workup. I will be available for any questions, please call as needed.  Thank you for this consult.     ____________________________ Dow AdolphMatthew Chelsae Zanella, MD mr:bu D: 07/29/2014 19:07:00 ET T: 07/29/2014  19:36:29 ET JOB#: [id number]  cc: Dow Adolph, MD, <Dictator> Kathalene Frames MD ELECTRONICALLY SIGNED 08/12/2014 15:12

## 2014-10-27 NOTE — Op Note (Signed)
PATIENT NAME:  Jacob Davis, Jacob Davis MR#:  [id number] DATE OF BIRTH:  Apr 18, 1981  DATE OF PROCEDURE:  07/26/2014  PREOPERATIVE DIAGNOSES:   1.  End-stage renal disease.  2.  Volume overload with atrial fibrillation with rapid ventricular response.   POSTOPERATIVE DIAGNOSES: 1.  End-stage renal disease.  2.  Volume overload with atrial fibrillation with rapid ventricular response.   PROCEDURES: 1.  Ultrasound guidance for vascular access to right internal jugular vein.  2.  Fluoroscopic guidance for placement of catheter.  3. Placement of a 19 cm tip-to-cuff tunneled hemodialysis catheter via the right internal jugular vein.   SURGEON:  Annice NeedyJason S. Nathon Stefanski, MD  ANESTHESIA: Local with sedation.   BLOOD LOSS: Minimal.   INDICATION FOR PROCEDURE: This 34 year old gentleman who previous has done peritoneal dialysis, was admitted with volume overload, BNP of 175,000, atrial fibrillation with rapid ventricular response. We are asked to place a PermCath for hemodialysis, to start at this time. Risks, benefits were discussed. Informed consent was obtained.   DESCRIPTION OF THE PROCEDURE: The patient was brought to the vascular and interventional radiology suite. The patient's right neck and chest were sterilely prepped and draped and a sterile surgical field was created. The right internal jugular vein was visualized with ultrasound and found to be patent. It was then accessed under direct ultrasound guidance and a permanent image was recorded. A wire was placed. After a skin nick and dilatation, the peel-away sheath was placed over the wire.   I then turned my attention to an area under the clavicle. Approximately 2 fingerbreadths below the clavicle a small counter incision was created and we tunneled from the subclavicular incision to the access site. Using fluoroscopic guidance, a 19 cm tip-to-cuff tunneled hemodialysis catheter was selected, tunneled from the subclavicular incision to the access site. It was  then placed through the peel-away sheath and the peel-away sheath was removed. The catheter tips were parked in the right atrium. The appropriate distal connectors were placed. It withdrew blood well and flushed easily with heparinized saline and a concentrated heparin solution was then placed. It was secured to the chest wall with 2 Prolene sutures. The access incision was closed with a single 4-0 Monocryl. A 4-0 Monocryl pursestring suture was placed around the exit site. Sterile dressings were placed.   The patient tolerated the procedure well and was taken to the recovery room in stable condition.    ____________________________ Annice NeedyJason S. Kleigh Hoelzer, MD jsd:MT D: 07/26/2014 08:43:23 ET T: 07/26/2014 13:24:17 ET JOB#: [id number]  cc: Annice NeedyJason S. Torrie Namba, MD, <Dictator> Annice NeedyJASON S Zakira Ressel MD ELECTRONICALLY SIGNED 07/31/2014 11:58

## 2014-10-27 NOTE — Discharge Summary (Signed)
PATIENT NAME:  Jacob Davis, Jacob Davis MR#:  [id number] DATE OF BIRTH:  May 05, 1981  DATE OF ADMISSION:  07/24/2014 DATE OF DISCHARGE:  07/29/2014  DATE OF POTENTIAL DISCHARGE: 07/29/2014  DISCHARGE AND TRANSFER DIAGNOSIS: Ischemic bowel.  SECONDARY DIAGNOSES:  1.  Ischemic bowel.  2.  Lethargy and metabolic encephalopathy.  3.  Abdominal pain, nausea, vomiting, likely due to ischemic bowel.  4.  Atrial fibrillation with rapid ventricular response.  5.  Right lower extremity weakness.  6.  Syncope.  7.  Type 2 diabetes.  8.  End-stage renal disease on dialysis.  9.  Hypertension.   CONSULTATIONS:  1.  Cardiology, Laurier Nancy, MD.  2.  Vascular surgery, Annice Needy, MD.  3.  Urology, Graceann Congress, MD.  4.  Gastroenterology, Dow Adolph, MD.  5.  Surgery, Si Raider. Lundquist, MD.   PROCEDURES AND RADIOLOGY: Tunneled hemodialysis catheter via right internal jugular vein by Dr. Barbara Cower dew on January 29.   Chest x-ray on January 27 showed no acute cardiopulmonary disease.   CT scan of the head without contrast on January 31 showed no acute intracranial abnormality.   CT scan of the head without contrast on February 1 showed no acute intracranial process.  CT scan of the abdomen and pelvis without contrast on February 1 showed extensive portal venous gas associated with dilatation and pneumatosis in the jejunum. Findings worrisome for ischemic bowel. Could be due to underlying diabetes and peripheral vascular disease. No perforation. Anasarca with generalized soft tissue edema, ascites and bilateral pleural effusion. Peritoneal dialysis catheter noted. Diffuse atherosclerosis. Extensive bladder wall thickening and calcification seen. Umbilical hernia containing fat and fluid.   A 2-D echocardiogram on January 28 showed LVEF of 35% to 40%. Impaired relaxation of LV diastolic filling. Moderately enlarged right ventricle. Severely dilated left atrium. Moderately dilated right atrium.    MAJOR LABORATORY PANEL: HBsAg was negative. Hepatitis B surface antibody was reactive. Intact PTH was elevated with a value of 760.   HISTORY AND SHORT HOSPITAL COURSE: The patient is a 34 year old male with a known history of diabetes, end-stage renal disease on peritoneal dialysis, hypertension, and depression who was admitted for rapid atrial fibrillation with syncope. Please see Dr. Casimiro Needle Diamond's dictated history and physical for further details. The patient was started on Cardizem. Cardiology consultation was obtained with Dr. Adrian Blackwater, who recommended 2-D echo, which was obtained with results dictated above. He changed patient from Cardizem to amiodarone drip and recommended starting heparin drip due to new-onset atrial fibrillation. He also recommended cardiac catheterization, which was scheduled for today. The patient also underwent tunneled dialysis catheter placement for switching him from peritoneal to hemodialysis while here in the hospital. Nephrology was continuing hemodialysis while in the hospital. Since yesterday the patient has been having some mental status changes for which he received 2 different CT scans of the head, which were both negative for any acute changes, and his mental status was thought to be maybe due to narcotic pain medication, although this morning he started having nausea, vomiting, and acute surgical abdominal symptoms with abdominal pain for which he underwent stat CT scan of the abdomen and pelvis, which was concerning for ischemic bowel for which surgical consultation was obtained with Dr. Ida Rogue, who agreed with the concern for ischemic bowel in the area, which is generally well perfused and was thought to be due to low flow from dehydration; certainly could be due to hemodialysis. He recommended transfer to tertiary care center with surgical  specialist in the upper GI area, preferably surgical oncologist if need, due to complexity of the  patient, young age, and need for potentially complex surgery in a relatively sensitive area. [facility] Dr. Ruben Imreesen. Please note, the patient was also evaluated by urology, Dr. 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 He did not recommend any surgical intervention at this time, but recommended continuing broad-spectrum antibiotic which he was on, and dressing changes. At this point, the patient remains a high-risk surgical candidate here at this hospital and is being [facility] as [facility] bed becomes available. While I am dictating, the last vitals are as follows: Temperature 97.4, heart rate 52 per minute, respirations 18 per minute, blood pressure 100/67. He was saturating 100% on room air.   The patient's family has been updated.  The base contact number I will label as his friend/caretaker whose name is Elby Showersody Fowler. Jerrye NobleCody Fowler's number is [phone], which I was able to communicate with him. He was very appreciative of update and he is agreeable to get [facility] at this point.  TOTAL TIME TAKING CARE OF THIS PATIENT: 45 minutes.  He remains at very high risk and is unstable and does [facility] for appropriate care.   ____________________________ Ellamae SiaVipul S. Sherryll BurgerShah, MD vss:ST D: 07/29/2014 22:07:43 ET T: 07/29/2014 22:31:08 ET JOB#: [id number]   cc: Alexiah Koroma S. Sherryll BurgerShah, MD, <Dictator> Courtney ParisElizabeth B. Ruben Imreesen, MD, [facility], [facility] Graceann CongressAdam G. Kaplan, MD Munsoor Lizabeth LeydenN. Lateef, MD Laurier NancyShaukat A. Khan, MD Dow AdolphMatthew Rein, MD Si Raiderhristopher A. Juliann PulseLundquist, MD Annice NeedyJason S. Dew, MD Amy K. Arvin CollardMottl, MD Patricia PesaVIPUL S Jayln Branscom MD ELECTRONICALLY SIGNED 08/01/2014 10:10

## 2014-10-27 NOTE — Consult Note (Signed)
CHIEF COMPLAINT and HISTORY:  Subjective/Chief Complaint renal failure, rapid heart rate   History of Present Illness Patient admitted with a. fib with RVR two days ago.  Has PD catheter and does PD at home.  Needs a HD catheter for hemodialysis while here and potentially ongoing.  Has volume overload and BNP 175,000 and this likely led to his a. fib.  We are asked to place catheter.  HR now controlled on medications and he has no complaints.   PAST MEDICAL/SURGICAL HISTORY:  Past Medical History:   left pelvic fx:    erectile dysfuntion:    Peritoneal Dialysis:    Dialysis:    htn:    Diabetes Mellitus, Type II (NIDD):    toe:    Back Surgery:    kidney surgery-blocked:   ALLERGIES:  Allergies:  Penicillin: Unknown  Amoxicillin: Hives, Itching  HOME MEDICATIONS:  Home Medications: Medication Instructions Status  ketoconazole topical 2% topical cream Apply topically to affected area once a day Active  clonazePAM 0.5 mg oral tablet 0.5 milligram(s) orally 4 times a day, As Needed - for Anxiety, Nervousness Active  Sensipar 30 mg oral tablet 60 milligram(s) orally once a day Active  carvedilol 25 mg oral tablet 1 tab(s) orally 2 times a day Active  citalopram 10 mg oral tablet 1 tab(s) orally once a day Active  hydrOXYzine hydrochloride hydrochloride 25 mg oral tablet 1 tab(s) orally 4 times a day, As Needed - for Itching Active  NovoLIN N human recombinant 100 units/mL subcutaneous suspension 1 dose(s) subcutaneous per sliding scale Active  NovoLOG 100 units/mL subcutaneous solution 1 dose(s) subcutaneous per sliding scale Active  Renvela carbonate 800 mg oral tablet 2 tab(s) orally 2 times a day, As Needed with snacks Active  gabapentin 300 mg oral capsule 1 cap(s) orally 2 times a day Active   Family and Social History:  Family History Diabetes Mellitus  Cancer  thyroid cancer   Social History negative tobacco, negative ETOH, negative Illicit drugs   Place of  Living Home   Review of Systems:  Subjective/Chief Complaint No TIA/stroke/seizure No heat or cold intolerance No dysuria/hematuria No blurry or double vision No tinnitus or ear pain No rashes or ulcer No suicidal ideation or psychosis No signs of bleeding or easy bruising Positive for shortness or breath with exertion positive for palpatations and arrhythmias No N/V/D or abdominal pain No joint pain or joint swelling No fever or chills No unintentional weight loss or gain   Medications/Allergies Reviewed Medications/Allergies reviewed   Physical Exam:  GEN well developed, well nourished   HEENT pink conjunctivae, hearing intact to voice   NECK No masses  trachea midline   RESP normal resp effort  no use of accessory muscles   CARD regular rate  no JVD   VASCULAR ACCESS -- Purulent drainage  PD catheter present   ABD denies tenderness  soft   GU no superpubic tenderness   LYMPH negative neck, negative axillae   EXTR negative cyanosis/clubbing, positive edema   SKIN normal to palpation, skin turgor poor   NEURO cranial nerves intact, motor/sensory function intact   PSYCH alert, A+O to time, place, person   LABS:  Laboratory Results: Thyroid:    28-Jan-16 04:18, Thyroid Stimulating Hormone  Thyroid Stimulating Hormone 1.87  0.45-4.50  (IU = International Unit)   -----------------------  Pregnant patients have   different reference   ranges for TSH:   - - - - - - - - - -  Pregnant, first trimetser:   0.36 - 2.50 uIU/mL  LabObservation:    28-Jan-16 09:52, Echo Doppler  OBSERVATION   Reason for Test  Hepatic:    27-Jan-16 22:08, Comprehensive Metabolic Panel  Bilirubin, Total 0.4  Alkaline Phosphatase 316  SGPT (ALT) 51  SGOT (AST) 63  Total Protein, Serum 7.3  Albumin, Serum 2.0  Cardiology:    27-Jan-16 22:09, ECG  Ventricular Rate 121  Atrial Rate 150  QRS Duration 90  QT 294  QTc 417  R Axis 8  T Axis -157  ECG interpretation    Atrial fibrillation with rapid ventricular response with premature ventricular or aberrantly conducted complexes  Minimal voltage criteria for LVH, may be normal variant  ST & T wave abnormality, consider inferolateral ischemia or digitalis effect  Abnormal ECG  When compared with ECG of 07-Jan-2014 22:56,  Atrial fibrillation has replaced Sinus rhythm  ----------unconfirmed----------  Confirmed by OVERREAD, NOT ([id number]), editor PEARSON, BARBARA ([id number]) on 07/25/2014 2:43:32 PM  ECG     28-Jan-16 09:52, Echo Doppler  Echo Doppler   REASON FOR EXAM:      COMMENTS:       PROCEDURE: [facility] - ECHO DOPPLER COMPLETE(TRANSTHOR)  - Jul 25 2014  9:52AM     RESULT: Echocardiogram Report    Patient Name:   Jacob BOHLKEN Date of Exam: 07/25/2014  Medical Rec #:  [id number]            Custom1:  Date of Birth:  May 17, 1981         Height:       72.0 in  Patient Age:    34 years          Weight:       212.0 lb  Patient Gender: M                 BSA:          2.18 m??    Indications: Atrial Fib  Sonographer:    Sherrie Sport RDCS  Referring Phys: Rosilyn Mings, S    Summary:   1. Left ventricular ejection fraction, by visual estimation, is 35 to   40%.   2. Impaired relaxation pattern of LV diastolic filling.   3. Moderately enlarged right ventricle.   4. Severely dilated left atrium.   5. Moderately dilated right atrium.   6. The pericardial effusion is globally pericardial effusion.   7. Trivial pericardial effusion, as described above.   8. Mild to moderate mitral valve regurgitation.   9. Mild aortic regurgitation.  10. Mild aortic valve sclerosis without stenosis.  11. Severely increased left ventricular posterior wall thickness.  12. Mild tricuspid regurgitation.  13. Intact interatrial and interventricular septa appear intact, with no   echocardiographic evidence of intracardiac shunting.  14. No cardiac thrombus is identified.  2D AND M-MODE MEASUREMENTS (normal ranges within  parentheses):  Left Ventricle:          Normal  IVSd (2D):      1.16 cm (0.7-1.1)  LVPWd (2D):     1.56 cm (0.7-1.1) Aorta/LA:                  Normal  LVIDd (2D):     4.45 cm (3.4-5.7) Aortic Root (2D): 3.10 cm (2.4-3.7)  LVIDs (2D):     3.68 cm           Left Atrium (2D): 5.70 cm (1.9-4.0)  LV FS (2D):     17.3 %   (>  25%)  LV EF (2D):     36.3 %   (>50%)                                    Right Ventricle:                                    RVd (2D):        9.62 cm  LV DIASTOLIC FUNCTION:  MV Peak E: 1.40 m/s E/e' Ratio: 21.40                      Decel Time: 158 msec  SPECTRAL DOPPLER ANALYSIS (where applicable):  Mitral Valve:  MV P1/2 Time: 45.82 msec  MV Area, PHT: 4.80 cm??  Aortic Valve: AoV Max Vel: 1.06 m/s AoV Peak PG: 4.5 mmHg AoV Mean PG:  LVOT Vmax: 0.72 m/s LVOT VTI:  LVOT Diameter: 2.00 cm  AoV Area, Vmax: 2.13 cm?? AoV Area, VTI:  AoV Area, Vmn:  Tricuspid Valve and PA/RV Systolic Pressure: TR Max Velocity: 2.94 m/s RA   Pressure: 5 mmHg RVSP/PASP: 39.7 mmHg  Pulmonic Valve:  PV Max Velocity: 1.40 m/s PV Max PG: 7.8 mmHg PV Mean PG:    PHYSICIAN INTERPRETATION:  Left Ventricle: The left ventricular internal cavity size was normal. LV   posterior wall thickness was severely increased. Left ventricular   ejection fraction, by visual estimation, is 35 to 40%. Spectral Doppler   shows impaired relaxation pattern of LV diastolic filling.  Right Ventricle: The right ventricular size is moderately enlarged.  Left Atrium: The left atrium is severely dilated.  Right Atrium: The right atrium is moderately dilated.  Pericardium: Trivial pericardial effusion is present. The pericardial   effusion is globally located around the entire heart.  Mitral Valve: Mild to moderate mitral valve regurgitation is seen.  Tricuspid Valve: Mild tricuspid regurgitation is visualized. The   tricuspid regurgitant velocity is 2.94 m/s, and with an assumed right   atrial pressure of 5 mmHg, the  estimated right ventricular systolic   pressure is normal at 39.7 mmHg.  Aortic Valve: The aortic valve is tricuspid. Mild aortic valve sclerosis     is present, with no evidence of aortic valve stenosis. Mild aortic valve   regurgitation is seen.  Shunts: Interatrial and interventricular septa appear intact, with no   evidence of intracardiac shunting by spectral or color Doppler.    Sherwood MD  Electronically signed by [id number] Neoma Laming MD  Signature Date/Time: 07/25/2014/2:30:30 PM    *** Final ***    IMPRESSION: .        Verified By: Emmit Pomfret. Humphrey Rolls, M.D., MD  Routine Chem:    27-Jan-16 22:08, B-Type Natriuretic Peptide [facility])  Result Comment   PBNP - UNABLE TO RESULT SPECIFIC VALUE. MAXIMUM   - DILUTION PERFORMED.   Result(s) reported on 25 Jul 2014 at 12:04AM.  B-Type Natriuretic Peptide [facility]) > 175000    27-Jan-16 22:08, Comprehensive Metabolic Panel  Glucose, Serum 163  BUN 62  Creatinine (comp) 11.97  Sodium, Serum 133  Potassium, Serum 2.8  Chloride, Serum 94  CO2, Serum 25  Calcium (Total), Serum 7.5  Osmolality (calc) 288  eGFR (African American) 6  eGFR (Non-African American) 5  eGFR values <92m/min/1.73 m2 may be an indication of chronic  kidney disease (  CKD).  Calculated eGFR, using the MRDR Study equation, is useful in   patients with stable renal function.  The eGFR calculation will not be reliable in acutely ill patients  when serum creatinine is changing rapidly. It is not useful in  patients on dialysis. The eGFR calculation may not be applicable  to patients at the low and high extremes of body sizes, pregnant  women, and vegetarians.  Anion Gap 14    27-Jan-16 22:08, Troponin I  Result Comment   TROPONIN - RESULTS VERIFIED BY REPEAT TESTING.   - C/ DERRICK POTTS '@[id number]'  07-24-14.Marland KitchenAJO   - READ-BACK PROCESS PERFORMED.   Result(s) reported on 24 Jul 2014 at 11:22PM.    28-Jan-16 10:35, Potassium, Serum  Potassium, Serum 3.0   Result(s) reported on 25 Jul 2014 at 11:06AM.    28-Jan-16 18:07, Potassium, Serum  Potassium, Serum 4.1  Result(s) reported on 25 Jul 2014 at [facility].    29-Jan-16 73:42, Basic Metabolic Panel (w/Total Calcium)  Glucose, Serum 115  BUN 73  Creatinine (comp) 13.29  Sodium, Serum 133  Potassium, Serum 3.9  Chloride, Serum 94  CO2, Serum 25  Calcium (Total), Serum 8.2  Anion Gap 14  Osmolality (calc) 289  eGFR (African American) 6  eGFR (Non-African American) 5  eGFR values <34m/min/1.73 m2 may be an indication of chronic  kidney disease (CKD).  Calculated eGFR, using the MRDR Study equation, is useful in   patients with stable renal function.  The eGFR calculation will not be reliable in acutely ill patients  when serum creatinine is changing rapidly. It is not useful in  patients on dialysis. The eGFR calculation may not be applicable  to patients at the low and high extremes of body sizes, pregnant  women, and vegetarians.  Cardiac:    27-Jan-16 22:08, Cardiac Panel  CK, Total 94  CPK-MB, Serum 6.6  Result(s) reported on 24 Jul 2014 at 11:04PM.    27-Jan-16 22:08, Troponin I  Troponin I 0.22  0.00-0.05  0.05 ng/mL or less: NEGATIVE   Repeat testing in 3-6 hrs   if clinically indicated.  >0.05 ng/mL: POTENTIAL   MYOCARDIAL INJURY. Repeat   testing in 3-6 hrs if   clinically indicated.  NOTE: An increase or decrease   of 30% or more on serial   testing suggests a   clinically important change  Routine Coag:    27-Jan-16 22:08, Activated PTT  Activated PTT (APTT) 32.6  A HCT value >55% may artifactually increase the APTT. In one study,  the increase was an average of 19%.  Reference: "Effect on Routine and Special Coagulation Testing Values  of Citrate Anticoagulant Adjustment in Patients with High HCT Values."  American Journal of Clinical Pathology 2006;126:400-405.    27-Jan-16 22:08, Prothrombin Time  Prothrombin 15.5  INR 1.3  INR reference interval  applies to patients on anticoagulant therapy.  A single INR therapeutic range for coumarins is not optimal for all  indications; however, the suggested range for most indications is  2.0 - 3.0.  Exceptions to the INR Reference Range may include: Prosthetic heart  valves, acute myocardial infarction, prevention of myocardial  infarction, and combinations of aspirin and anticoagulant. The need  for a higher or lower target INR must be assessed individually.  Reference: The Pharmacology and Management of the Vitamin K   antagonists: the seventh ACCP Conference on Antithrombotic and  Thrombolytic Therapy. CAJGOT.1572Sept:126 (3suppl): [id number]  A HCT value >55% may artifactually increase the PT.  In  one study,   the increase was an average of 25%.  Reference:  "Effect on Routine and Special Coagulation Testing Values  of Citrate Anticoagulant Adjustment in Patients with High HCT Values."  American Journal of Clinical Pathology 2006;126:400-405.  Routine Hem:    27-Jan-16 22:08, Hemogram, Platelet Count  WBC (CBC) 8.9  RBC (CBC) 3.48  Hemoglobin (CBC) 9.0  Hematocrit (CBC) 27.6  Platelet Count (CBC) 217  Result(s) reported on 24 Jul 2014 at 11:04PM.  MCV 79  MCH 25.9  MCHC 32.7  RDW 17.3   RADIOLOGY:  Radiology Results: XRay:    13-Apr-15 16:39, Foot Left Complete  Foot Left Complete  REASON FOR EXAM:    infection, non healing wound, osteomylitis  COMMENTS:       PROCEDURE: DXR - DXR FOOT LT COMP W/OBLIQUES  - Oct 08 2013  4:39PM     CLINICAL DATA:  Nonhealing wound on bottom of foot. Rule out  osteomyelitis. Diabetic.    EXAM:  LEFT FOOT - COMPLETE 3+ VIEW    COMPARISON:  None.    FINDINGS:  Markedly age advanced vascular calcifications. Diffuse soft tissue  swelling. Small Achilles and calcaneal spurs. Tibiotalar  osteoarthritis. No definite soft tissue ulcer identified. No soft  tissue gas. No osseous destruction.     IMPRESSION:  Soft tissue swelling, without  plain film evidence of osteomyelitis.    Markedly advanced vascular calcifications.      Electronically Signed    By: Abigail Miyamoto M.D.    On: 10/08/2013 18:25       Verified By: Areta Haber, M.D.,    19-Oct-15 20:11, Chest PA and Lateral  Chest PA and Lateral  REASON FOR EXAM:    Fall, left chest pain  COMMENTS:       PROCEDURE: DXR - DXR CHEST PA (OR AP) AND LATERAL  - Apr 15 2014  8:11PM     CLINICAL DATA:  Pain across the intact chest. Cough. No shortness of  breath. Patient cannot take a deep breath.    EXAM:  CHEST  2 VIEW    COMPARISON:  10/10/2006    FINDINGS:  Lung volumes are relatively low. There is mild lung base reticular  opacity likely subsegmental atelectasis. There is also mild diffuse  interstitial prominence. There is no focal consolidation to suggest  pneumonia no convincing pulmonary edema. No pleural effusion or  pneumothorax.    Cardiac silhouette is borderline enlarged. No mediastinal or hilar  masses or convincing adenopathy.    There has been a previous posterior thoracic spine fusion, new from  the prior exam. Orthopedic hardware appears well seated. Bony thorax  is diffusely     IMPRESSION:  No acute cardiopulmonary disease.  Electronically Signed    By: Lajean Manes M.D.    On: 04/15/2014 20:40         Verified By: Lasandra Beech, M.D.,    19-Oct-15 20:11, Hip Left Complete  Hip Left Complete  REASON FOR EXAM:    fall, pain  COMMENTS:       PROCEDURE: DXR - DXR HIP LEFT COMPLETE  - Apr 15 2014  8:11PM     CLINICAL DATA:  34 year old male with left hip pain and swelling  after slip and fall 4 days ago. Initial encounter.    EXAM:  LEFT HIP - COMPLETE 2+ VIEW    COMPARISON:  CT Abdomen and Pelvis 03/10/2012.    FINDINGS:  Peritoneal dialysis catheter right lower quadrant. Diffuse calcified  atherosclerosis. 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 Femoral heads normally  located.  Stable hip joint spaces.    Left superior and inferior pubic rami deformities are new since  2013. Proximal left femur intact. No other pelvis fracture  identified.     IMPRESSION:  1. Mild or minimally displaced left pubic rami fractures are new  since 2013, favor acute in this setting.  2. No superimposed left hip fracture identified.  3. 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    Electronically Signed    By: Lars Pinks M.D.    On: 04/15/2014 20:18         Verified By: Gwenyth Bender. HALL, M.D.,    [phone] 13:06, Foot Left Complete  Foot Left Complete  REASON FOR EXAM:    non healing wound, osteomyelitis, swelling fax   results [phone]  COMMENTS:       PROCEDURE: DXR - DXR FOOT LT COMP W/OBLIQUES  - May 06 2014  1:06PM     CLINICAL DATA:  Nonhealing wound, osteomyelitis. Bandage could not  be remote for imaging.    EXAM:  LEFT FOOT - COMPLETE 3+ VIEW    COMPARISON:  None.    FINDINGS:  There is no osseous erosion within the midfoot, metatarsals, or  phalanges to localize osteomyelitis. No subcutaneous gas is evident.  Soft tissue swelling of the dorsum of the foot.     IMPRESSION:  No radiographic evidence of osteomyelitis. Soft tissue swelling over  the dorsum of foot.      Electronically Signed    By: Suzy Bouchard M.D.    On: 05/06/2014 14:00         Verified By: Gwynn Burly EDMUNDS,M.D.,    09-Nov-15 13:06, Foot Right Complete  Foot Right Complete  REASON FOR EXAM:    non healing wound, osteomyelitis, swelling fax   results [phone]  COMMENTS:       PROCEDURE: DXR - DXR FOOT RT COMPLETE W/OBLIQUES  - May 06 2014  1:06PM     CLINICAL DATA:  Nonhealing wound. Osteomyelitis of the foot. Initial  encounter.    EXAM:  RIGHT FOOT COMPLETE - 3+ VIEW    COMPARISON:  None.    FINDINGS:  Diffuse swelling of the foot and ankle is present. Transmetatarsal  amputation of the great toe. Small vessel atherosclerosis  is  present. There is no gas tracking within the soft tissues. The  second toe shows medial subluxation of the proximal phalanx on the  metatarsal head. Marginal erosions are present, suspicious for gout  arthritis. Correlation with serum uric acid is recommended.  Calcaneal spurs are present. Thickening of the distal Achilles  tendon at the insertion suggesting tendinosis. 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     IMPRESSION:  1. No acute osseous abnormality.  2. No plain film evidence of osteomyelitis.  3. Question gout arthritis.Correlation with serum uric acid  recommended.  4. Diffuse forefoot soft tissue swelling.      Electronically Signed    By: Dereck Ligas M.D.    On: 05/06/2014 14:00         Verified By: Melvyn Novas, M.D.,    [phone] 22:32, Foot Right AP and Lateral  Foot Right AP and Lateral  REASON FOR EXAM:    4TH TOE BLACK  COMMENTS:       PROCEDURE: DXR - DXR FOOT RIGHT AP AND LATERAL  - May 16 2014 10:32PM     CLINICAL  DATA:  Wounds at BILATERAL feet, undergoing treatment at  wound clinic, BILATERAL leg pain, diabetes, prior RIGHT great toe  amputation    EXAM:  RIGHT FOOT - 2 VIEW    COMPARISON:  05/06/2014    FINDINGS:  Marked osseous demineralization.    Soft tissue lucency gas at plantar medial margin of RIGHT foot at  the level of the distal first metatarsal.    Post amputation of first ray through the level of the distal first  metatarsal.    Remaining joint spaces preserved.    Mild scattered spur formation at MTP joints on lateral view.    Calcaneal spurring.    Diffuse soft tissue swelling in scattered small vessel vascular  calcifications.    No acute fracture, dislocation or bone destruction.     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    New soft tissue gas at plantar medial border of the RIGHT foot at  the level of the distal first metatarsal question ulcer.    No  definite acute bones destruction identified to suggest  osteomyelitis though if this remains a clinical concern, consider MR  imaging.    Electronically Signed    By: Lavonia Dana M.D.    On: 05/16/2014 22:35         Verified By: Burnetta Sabin, M.D.,    27-Jan-16 22:32, Chest Portable Single View  Chest Portable Single View  REASON FOR EXAM:    Chest Pain  COMMENTS:       PROCEDURE: DXR - DXR PORTABLE CHEST SINGLE VIEW  - Jul 24 2014 10:32PM     CLINICAL DATA:  Seizure, cough for 1 week    EXAM:  PORTABLE CHEST - 1 VIEW    COMPARISON:  04/15/2014    FINDINGS:  There is mild bilateral chronic interstitial thickening. There is no  focal parenchymal opacity, pleural effusion, or pneumothorax. There  is stable cardiomegaly.    There is posterior thoracic fusion hardware incompletely evaluated.     IMPRESSION:  No active disease.      Electronically Signed    By: Kathreen Devoid    On: 07/24/2014 22:51         Verified By: Jennette Banker, M.D., MD  [place]:    13-Jul-15 23:42, [place] Color Flow Doppler Low Extrem Left (Leg)  [place] Color Flow Doppler Low Extrem Left (Leg)  REASON FOR EXAM:    left leg edema, pain, tender  COMMENTS:       PROCEDURE: [place]  - US DOPPLER LOW EXTR LEFT  - Jan 07 2014 11:42PM     CLINICAL DATA:  Leg edema and tenderness    EXAM:  Left LOWER EXTREMITY VENOUS DOPPLER ULTRASOUND    TECHNIQUE:  Gray-scale sonography with graded compression, as well as color  Doppler and duplex ultrasound were performed to evaluate the lower  extremity deep venous systems from the level of the common femoral  vein and including the common femoral, femoral, profunda femoral,  popliteal and calf veins including the posterior tibial, peroneal  and gastrocnemius veins when visible. The superficial great  saphenous vein was also interrogated. Spectral Doppler was utilized  to evaluate flow at rest and with distal augmentation maneuvers in  the common femoral, femoral and  popliteal veins.    COMPARISON:  None.    FINDINGS:  Common Femoral Vein: No evidence of thrombus. Normal  compressibility, respiratory phasicity and response to augmentation.  Saphenofemoral Junction: No evidence of thrombus.    Profunda Femoral Vein: No evidence of thrombus.  Femoral Vein: No evidence of thrombus.    Popliteal Vein: No evidence of thrombus.    Calf Veins: Not visualized.    Superficial Great Saphenous Vein: No evidence of thrombus.    Other Findings: Diffuse subcutaneous edema present within the  calf/lower leg.     IMPRESSION:  No evidence of deep venous thrombosis in the left lower extremity.  Note that the deep calf veins were not visible.  Electronically Signed    By: Jorje Guild M.D.    On: 01/08/2014 00:51         Verified By: Gilford Silvius, M.D.,    07-Nov-15 23:47, [place] Color Flow Doppler Low Extrem Bilat (Legs)  [place] Color Flow Doppler Low Extrem Bilat (Legs)  REASON FOR EXAM:    bilat edema and pain  COMMENTS:       PROCEDURE: [place]  - US DOPPLER LOW EXTR BILATERAL  - May 04 2014 11:47PM     CLINICAL DATA:  Bilateral edema and pain.  Color changes.    EXAM:  BILATERAL LOWER EXTREMITY VENOUS DOPPLER ULTRASOUND    TECHNIQUE:  Gray-scale sonography with graded compression, as well as color  Doppler and duplex ultrasound were performed to evaluate the lower  extremity deep venous systems from the level of the common femoral  vein and including the common femoral, femoral, profunda femoral,  popliteal and calf veins including the posterior tibial, peroneal  and gastrocnemius veins when visible. The superficial great  saphenous vein was also interrogated. Spectral Doppler was utilized  to evaluate flow at rest and with distal augmentation maneuvers in  the common femoral, femoral and popliteal veins.    COMPARISON:  01/07/2014    FINDINGS:  RIGHT LOWER EXTREMITY    Common Femoral Vein: No evidence of thrombus.  Normal  compressibility, respiratory phasicity and response to augmentation.    Saphenofemoral Junction: No evidence of thrombus. Normal  compressibility and flow on color Doppler imaging.    Profunda Femoral Vein: No evidence of thrombus. Normal  compressibility and flow on color Doppler imaging.    Femoral Vein: No evidence of thrombus. Normal compressibility,  respiratory phasicity and response to augmentation.    Popliteal Vein: No evidence of thrombus. Normal compressibility,  respiratory phasicity and response to augmentation.    Calf Veins: Not visualized due to patient's body habitus and leg  edema.    Superficial Great Saphenous Vein: No evidence of thrombus. Normal  compressibility and flow on color Doppler imaging.    Venous Reflux:  None.    Other Findings: Prominent lymphnodes demonstrated in the right  groin region measuring up to about 1.2 cm short axis dimension.    LEFT LOWER EXTREMITY    Common Femoral Vein: No evidence of thrombus. Normal  compressibility, respiratory phasicity and response to augmentation.    Saphenofemoral Junction: No evidence of thrombus. Normal  compressibility and flow on color Doppler imaging.  Profunda Femoral Vein: No evidence of thrombus. Normal  compressibility and flow on color Doppler imaging.    Femoral Vein: No evidence of thrombus. Normal compressibility,  respiratory phasicity and response to augmentation.    Popliteal Vein: No evidence of thrombus. Normal compressibility,  respiratory phasicity and response to augmentation.    Calf Veins: Not visualized due to patient's body habitus and leg  edema.    Superficial Great Saphenous Vein: No evidence of thrombus. Normal  compressibility and  flow on color Doppler imaging.  Venous Reflux:  None.    Other Findings: Prominent lymph nodes demonstrated in the left groin  region, measuring up to about 1 cm short axis dimension.     IMPRESSION:  No evidence of deep venous  thrombosis. Calf veins were not  visualized. Prominent lymph nodes demonstrated in the groin regions  bilaterally of nonspecific etiology.      Electronically Signed    By: Lucienne Capers M.D.    On: 05/04/2014 23:52     Verified By: Neale Burly, M.D.,  [place]:    13-Apr-15 16:39, Foot Left Complete  PACS Image    13-Jul-15 23:42, [place] Color Flow Doppler Low Extrem Left (Leg)  PACS Image    19-Oct-15 20:11, Chest PA and Lateral  PACS Image    19-Oct-15 20:11, Hip Left Complete  PACS Image    07-Nov-15 23:47, [place] Color Flow Doppler Low Extrem Bilat (Legs)  PACS Image    19-Nov-15 22:32, Foot Right AP and Lateral  PACS Image    20-Nov-15 02:06, Body Fluid Culture w/Smear  Ind. Clindamycin Resistance    20-Nov-15 18:00, Body Fluid Nucleated Cell Count, Diff  Result Interpretation  Peritoneal dialysate cytospin review shows abundant neutrophils,  consistent with peritonitis. Dr. Luana Shu    21-Nov-15 08:37, Wound Aerobic/Anaerobic Culture  Ind. Clindamycin Resistance    21-Nov-15 20:38, Wound Aerobic/Anaerobic Culture  Ind. Clindamycin Resistance    22-Nov-15 08:35, Body Fluid Nucleated Cell Count, Diff  Result Interpretation  Peritoneal dialysate cytospin is negative for malignant cells.  Dr. Luana Shu   Result(s) reported on 19 May 2014 at 11:01AM.    27-Jan-16 22:32, Chest Portable Single View  PACS Image   ASSESSMENT AND PLAN:  Assessment/Admission Diagnosis ESRD with markedly elevated BNP and a. fib. We are asked to place a Permcath by nephrology Other medical issues as above   Plan Permcath placed without difficulty. Not clear if he will be switching over to HD long term, or going back to PD once this acute issue has resolved.  If he is switching to HD, will be happy to see in office and evaluate for AVF/AVG. OK to use permcath at this time    level 4 consult   Electronic Signatures: Algernon Huxley (MD)  (Signed 29-Jan-16 08:39)  Authored: Chief Complaint  and History, PAST MEDICAL/SURGICAL HISTORY, ALLERGIES, HOME MEDICATIONS, Family and Social History, Review of Systems, Physical Exam, LABS, RADIOLOGY, Assessment and Plan   Last Updated: 29-Jan-16 08:39 by Algernon Huxley (MD)

## 2014-10-27 NOTE — Consult Note (Signed)
PATIENT NAME:  Jacob Davis, Ramal MR#:  [id number] DATE OF BIRTH:  11-09-1980  DATE OF CONSULTATION:  07/25/2014  REFERRING PHYSICIAN:   CONSULTING PHYSICIAN:  Alinda SierrasEileen A. Ellenor Wisniewski, PA-C  INDICATION FOR CONSULTATION: Atrial fibrillation with RVR and syncope.   HISTORY OF PRESENT ILLNESS: This is a 34 year old Caucasian male who presented to the Emergency Department after episode of syncope last night. He states he was sitting on his couch watching TV when he suddenly lost consciousness and started shaking. His roommate called EMS. He was found to have ventricular rate of 180 and thus was admitted and cardiology was consulted.   PAST MEDICAL HISTORY: Hypertension, diabetes mellitus, end-stage renal disease on peritoneal dialysis and depression.   MEDICATIONS: Carvedilol 25 mg b.i.d., citalopram 10 mg daily, clonazepam 0.5 mg q.i.d. p.r.n. anxiety, gabapentin 300 mg p.o. b.i.d., Renvela 800 mg 2 tablets b.i.d., Sensipar 30 mg 1 tablet daily.   FAMILY HISTORY: No significant family history of cardiac disease.   SOCIAL HISTORY: He denies tobacco, EtOH or other drug use.   REVIEW OF SYSTEMS: CONSTITUTIONAL: Positive for fatigue and malaise.  RESPIRATORY: No shortness of breath, cough or wheezing.  CARDIOVASCULAR: No chest pain, palpitations, orthopnea, or PND.  GASTROINTESTINAL: No abdominal pain, nausea, vomiting or diarrhea.   ALLERGIES: AMOXICILLIN AND PENICILLIN.   PHYSICAL EXAMINATION: VITAL SIGNS: Temperature 97.7, pulse 111, respirations 18, blood pressure 123/99, pulse ox 100% saturation on room air.  HEENT: No JVD or Carotid Bruit Respiratory: Crackles and rhonchi at bases Cardiovascular: irregularly irregular, no audible murmur.  Extremities: 2+ pedal edema bilaterally  PERTINENT DIAGNOSTIC DATA: BNP 175,000. Creatinine 11.97. Troponin 0.22.  EKG shows atrial fibrillation with rapid ventricular rate, 121 beats per minute. Nonspecific ST and T changes.   ASSESSMENT AND PLAN: New  onset atrial fibrillation with rapid ventricular response:  Advise changing diltiazem to amiodarone drip. Agree with continuation of Eliquis and carvedilol. We will await echo results to see if new onset systolic congestive heart failure was cause of atrial fibrillation. Plan on getting outpatient nuclear stress test as the patient has already gotten Eliquis and denies chest pain at this time. Will follow.   Thank you very much for this consultation.   ____________________________ Alinda SierrasEileen A. Margarito CourserRomano, PA-C ear:sb D: 07/25/2014 08:47:01 ET T: 07/25/2014 09:12:40 ET JOB#: [id number]  cc: Marjean DonnaEileen A. Margarito Courseromano, PA-C, <Dictator> Alinda SierrasEILEEN A Khristie Sak PA ELECTRONICALLY SIGNED 08/06/2014 8:46

## 2014-10-27 NOTE — H&P (Signed)
   Subjective/Chief Complaint Abdominal pain, leukocytosis, portal vein gas and distal duodenal/proximal jejunal pneumotosis   History of Present Illness 34 yo M with history of multiple medical issues who presented late January with a fib with RVR, syncopal episode.  Has been treated with amiodarone.  C/o abdominal pain and has been getting more confused.  CT scan today shows PV gas and distal duodenal/proximal jejunal pneumotosis.  Is unable to answer questions appropriately.  Per nursing has been nauseated and vomiting.   Past History Atrial fibrillation with RVR ESRD, previously on peritoneal dialysis, now on HD HTN Depression H/o amputations H/o back surgery   Past Medical Health Diabetes Mellitus   Code Status Full Code   Past Med/Surgical Hx:  left pelvic fx:   erectile dysfuntion:   Peritoneal Dialysis:   Dialysis:   htn:   Diabetes Mellitus, Type II (NIDD):   toe:   Back Surgery:   kidney surgery-blocked:   ALLERGIES:  Penicillin: Unknown  Amoxicillin: Hives, Itching  Family and Social History:  Family History Diabetes Mellitus  Cancer   Social History negative tobacco, negative ETOH, negative Illicit drugs   Review of Systems:  Subjective/Chief Complaint Abdominal pain   Fever/Chills No   Cough No   Sputum No   Abdominal Pain Yes   Diarrhea No   Constipation No   Nausea/Vomiting Yes   SOB/DOE No   Chest Pain No   Dysuria No  Oliguric   Tolerating Diet No  Nauseated  Vomiting   Physical Exam:  GEN well developed, no acute distress, Cannot answer where he is but communicative   HEENT PERRL, hearing intact to voice, poor dentition   RESP normal resp effort  clear BS  no use of accessory muscles   CARD regular rate  no thrills   ABD positive tenderness  positive hernia   EXTR negative cyanosis/clubbing, negative edema   SKIN normal to palpation, No rashes, No ulcers   NEURO cranial nerves intact, negative rigidity, follows commands,  strength:   PSYCH A+O to time, place, person, good insight    Assessment/Admission Diagnosis 34 yo M with DM, ESRD on HD, admit with afib/RVR.  Pneumotosis on CT, abdomen relatively nontender.  Concern for ischemic bowel in an area which is generally well perfused, likely due to low flow from dehydration, possibly due to HD.  WBC up.   Plan IV abx, NG tube.  Will require TPN.  Would recommend resuscitation carefully.  Due to complexity of patient and need for potentially complex surgery in a relatively sensitive area, would recommend rapid transfer to tertiary care center with surgical specialist in this upper GI area (surgical oncologist comes to mind)   Electronic Signatures: Jarvis NewcomerLundquist, Arden Axon A (MD)  (Signed 01-Feb-16 18:23)  Authored: CHIEF COMPLAINT and HISTORY, PAST MEDICAL/SURGIAL HISTORY, ALLERGIES, FAMILY AND SOCIAL HISTORY, REVIEW OF SYSTEMS, PHYSICAL EXAM, ASSESSMENT AND PLAN   Last Updated: 01-Feb-16 18:23 by Jarvis NewcomerLundquist, Deano Tomaszewski A (MD)

## 2015-11-26 IMAGING — CT CT ABD-PELV W/O CM
2 of 4 series · 15 of 46 positions shown, 17 images · non-contrast
Comparison: Abdominal pelvic CT 03/10/2012.

CLINICAL DATA: Abdominal pain with nausea and vomiting. History of
renal failure. Initial encounter.

EXAM:
CT ABDOMEN AND PELVIS WITHOUT CONTRAST
TECHNIQUE: Multidetector CT imaging of the abdomen and pelvis was performed
following the standard protocol without IV contrast.

[Series 2: routine abd pel without · axial · non-contrast · 0.80mm/px · z∈[-466,-46]mm · 12 of 100 slices shown, 14 images]
[im 8/100  soft-tissue]
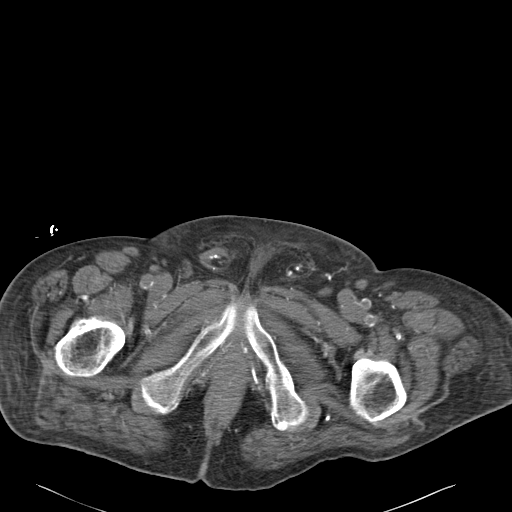
[im 8/100  bone]
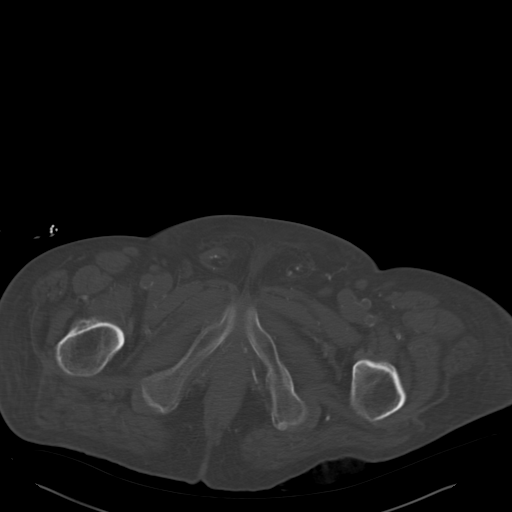
[im 15/100  soft-tissue]
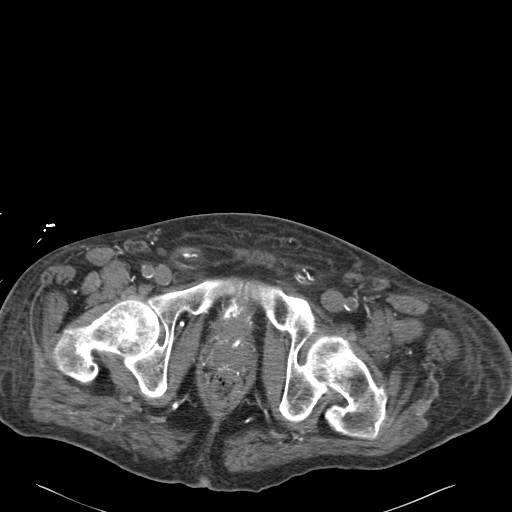
[im 23/100  soft-tissue]
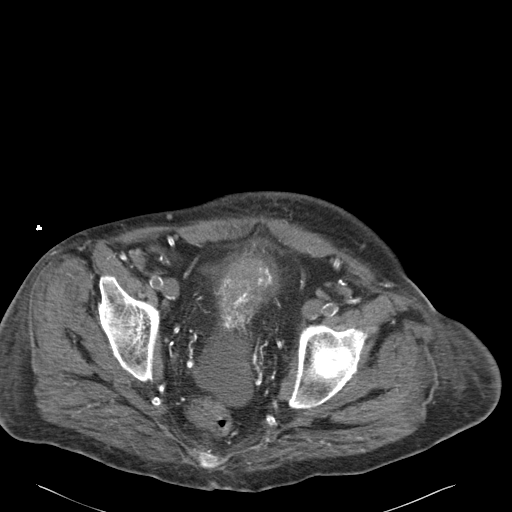
[im 30/100  soft-tissue]
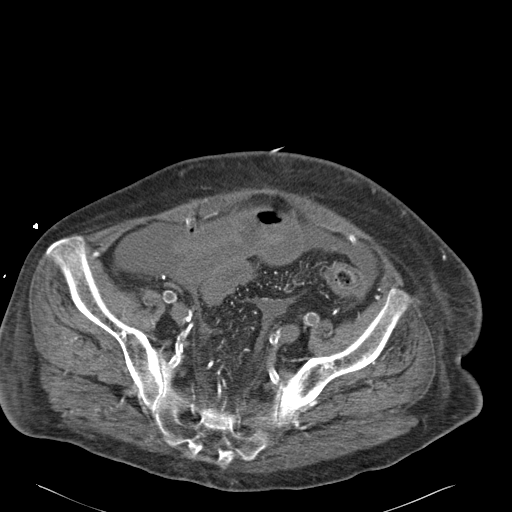
[im 37/100  soft-tissue]
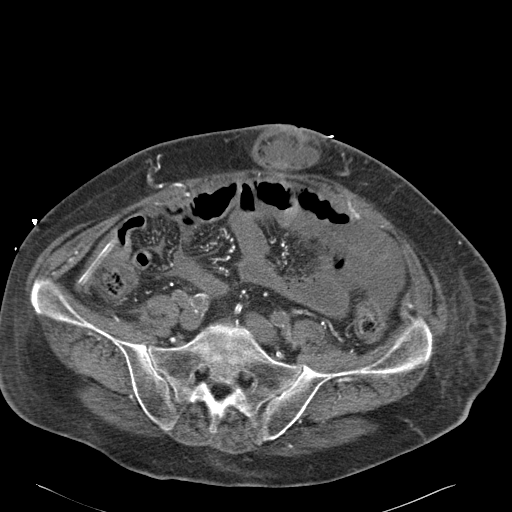
[im 45/100  soft-tissue]
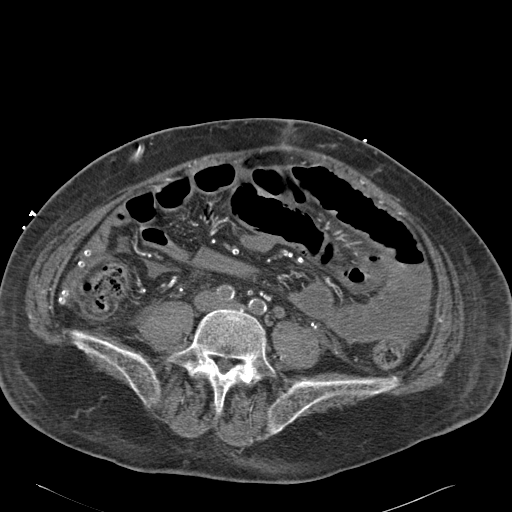
[im 56/100  soft-tissue]
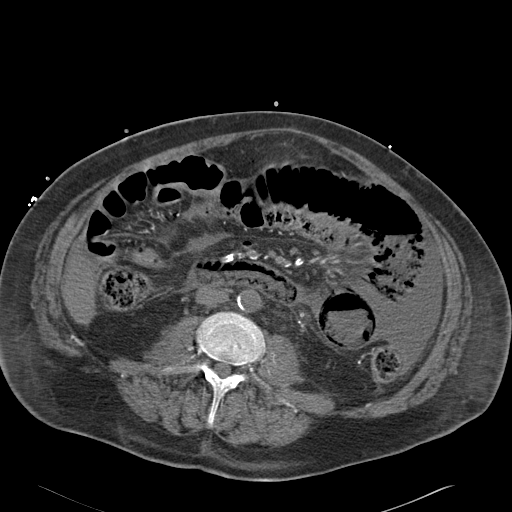
[im 63/100  soft-tissue]
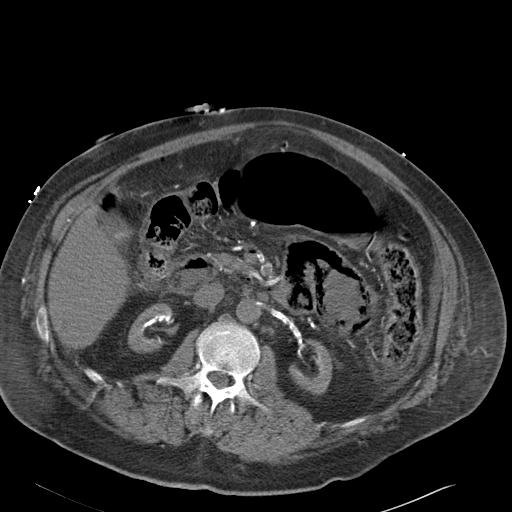
[im 70/100  soft-tissue]
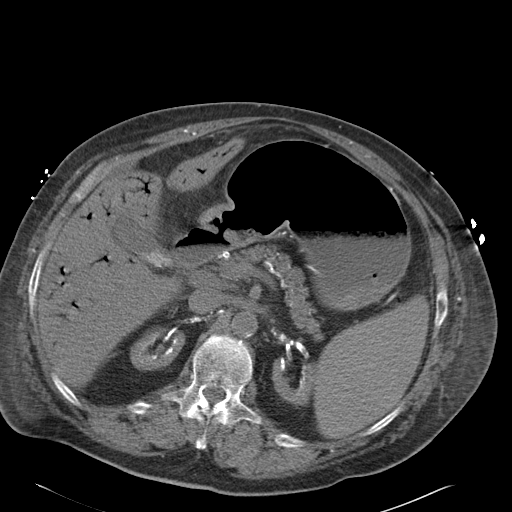
[im 70/100  bone]
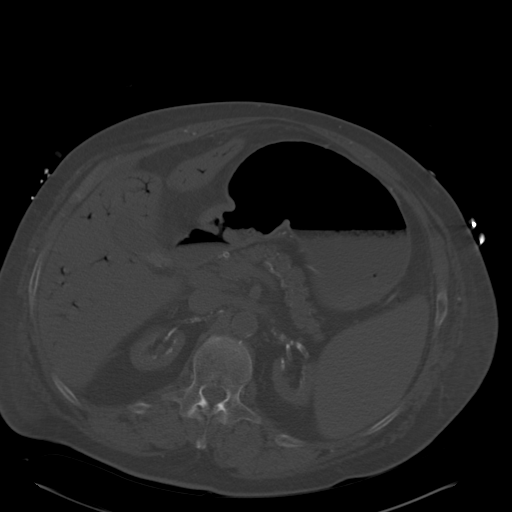
[im 78/100  soft-tissue]
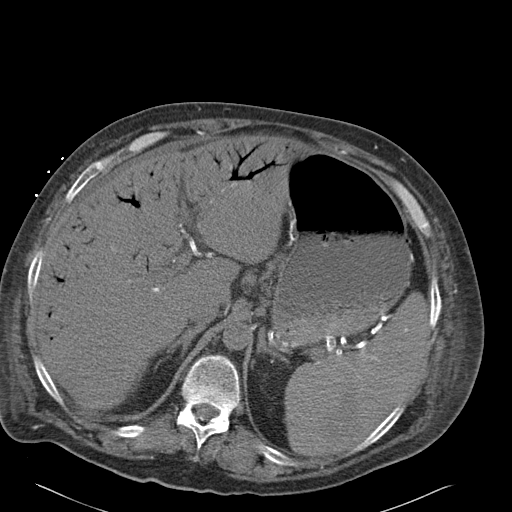
[im 85/100  soft-tissue]
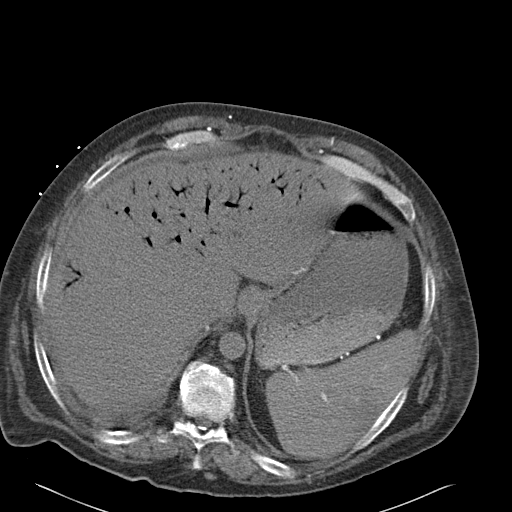
[im 92/100  soft-tissue]
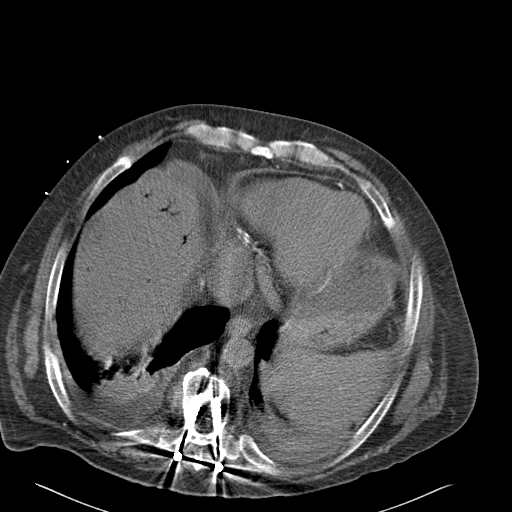

[Series 5: cor routine abd pel wo · coronal · 0.89mm/px · 3 of 148 slices shown]
[im 50/148  soft-tissue]
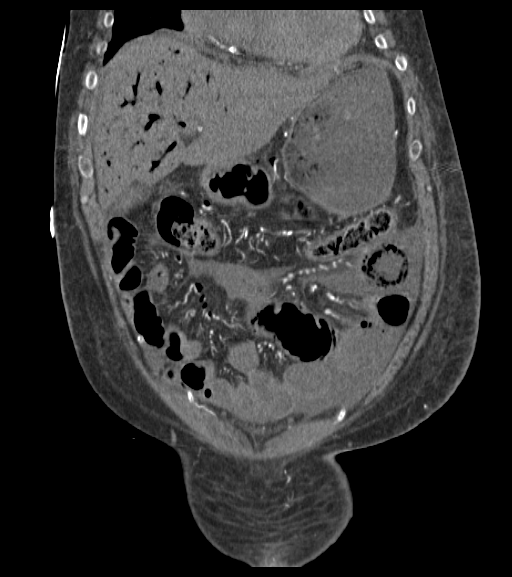
[im 66/148  soft-tissue]
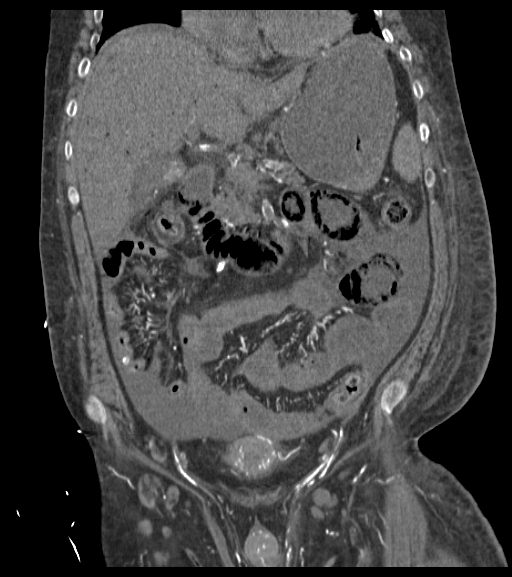
[im 82/148  soft-tissue]
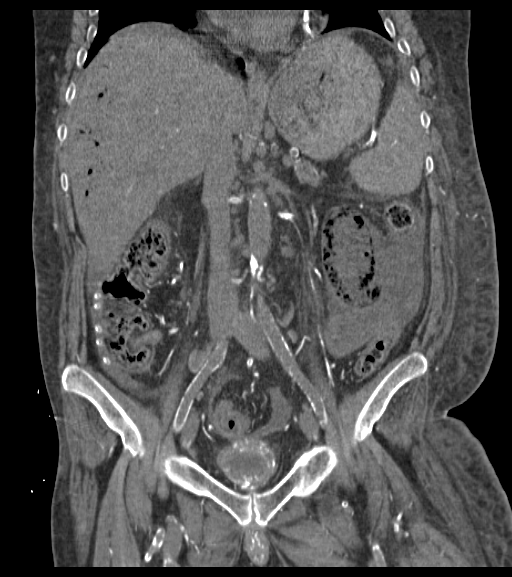

[15 of 46 positions shown; findings below may reference images not displayed]

FINDINGS: Lower chest: There are bilateral pleural effusions with associated
bibasilar airspace opacities. No significant pericardial fluid
identified. Diffuse coronary artery calcifications are noted.

Hepatobiliary: There is extensive portal venous gas throughout the
liver. Gas is also demonstrated within the superior mesenteric vein.
No focal hepatic lesions identified. There are small gallstones
within the gallbladder lumen. There is mild gallbladder wall
thickening and pericholecystic fluid. There is no gas within the
biliary system.

Pancreas:  Atrophied without focal abnormality.

Spleen: Normal in size without focal abnormality.

Adrenals/Urinary Tract: Both adrenal glands appear normal.Both
kidneys demonstrate severe atrophy and cortical thinning. There are
progressive renovascular calcifications bilaterally. No
hydronephrosis. There are extensive calcifications throughout the
bladder wall which is diffusely thickened.

Stomach/Bowel: The stomach and proximal small bowel are mildly
distended. There is mild pneumatosis within the jejunum. The distal
small bowel and colon are normal in caliber. No colonic pneumatosis
identified. The appendix is not well visualized.Peritoneal catheter
is noted within the right pericolic gutter. There is a moderate
amount of ascites without focal extraluminal fluid collection.

Vascular/Lymphatic: Scattered small retroperitoneal and mesenteric
lymph nodes are likely reactive. Diffuse atherosclerosis of the
aorta, its branches and the iliac arteries noted.

Reproductive: The prostate gland and seminal vesicles demonstrate no
significant findings. There are diffuse calcifications of the vas
deferens.

Other: There is generalized subcutaneous edema. There is an
umbilical hernia containing fat and ill-defined fluid, measuring
cm. No herniated bowel.

Musculoskeletal: No acute osseous findings demonstrated. There is
sclerosis of both femoral heads without subchondral collapse.
Patient is status post lower thoracic fusion. Thin dural
calcification noted.
IMPRESSION: 1. Extensive portal venous gas associated with dilatation and
pneumatosis in the jejunum. These findings are worrisome for
ischemic bowel, probably related to underlying diabetes and
peripheral vascular disease. No bowel obstruction or definite
perforation identified. General surgical consultation recommended.
2. Anasarca with generalized soft tissue edema, ascites and
bilateral pleural effusions. Peritoneal dialysis catheter noted.
3. Extensive bladder wall thickening and calcifications have
developed since the prior CT of last than 3 years ago. These
calcifications are likely related to impaired calcium metabolism
given their diffuse nature. Other considerations include
postinflammatory, drug reaction, amyloidosis and infiltrating tumor.
4. Diffuse atherosclerosis.
5. Umbilical hernia containing fat and fluid.
6. Critical Value/emergent results were called by telephone at the
time of interpretation on 07/29/2014 at [DATE] to Dr. ALDRE TITE ,
who verbally acknowledged these results.
